# Patient Record
Sex: Female | Born: 1990 | Race: White | Hispanic: No | Marital: Married | Smoking: Never smoker
Health system: Southern US, Community
[De-identification: ages and names within clinical notes are randomized; demographics above are authoritative.]

## PROBLEM LIST (undated history)

## (undated) ENCOUNTER — Inpatient Hospital Stay (HOSPITAL_COMMUNITY): Payer: Self-pay

## (undated) DIAGNOSIS — J45909 Unspecified asthma, uncomplicated: Secondary | ICD-10-CM

## (undated) DIAGNOSIS — F419 Anxiety disorder, unspecified: Secondary | ICD-10-CM

## (undated) DIAGNOSIS — F329 Major depressive disorder, single episode, unspecified: Secondary | ICD-10-CM

## (undated) DIAGNOSIS — R102 Pelvic and perineal pain: Secondary | ICD-10-CM

## (undated) DIAGNOSIS — N926 Irregular menstruation, unspecified: Secondary | ICD-10-CM

## (undated) DIAGNOSIS — G8929 Other chronic pain: Secondary | ICD-10-CM

## (undated) DIAGNOSIS — R5383 Other fatigue: Secondary | ICD-10-CM

## (undated) DIAGNOSIS — F32A Depression, unspecified: Secondary | ICD-10-CM

## (undated) DIAGNOSIS — F909 Attention-deficit hyperactivity disorder, unspecified type: Secondary | ICD-10-CM

## (undated) DIAGNOSIS — G43909 Migraine, unspecified, not intractable, without status migrainosus: Secondary | ICD-10-CM

## (undated) HISTORY — PX: WISDOM TOOTH EXTRACTION: SHX21

---
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
## 2009-04-13 ENCOUNTER — Emergency Department (HOSPITAL_COMMUNITY): Admission: EM | Admit: 2009-04-13 | Discharge: 2009-04-13 | Payer: Self-pay | Admitting: Emergency Medicine
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
## 2011-09-19 ENCOUNTER — Encounter (HOSPITAL_COMMUNITY): Payer: Self-pay | Admitting: *Deleted

## 2011-09-19 ENCOUNTER — Emergency Department (HOSPITAL_COMMUNITY)
Admission: EM | Admit: 2011-09-19 | Discharge: 2011-09-19 | Disposition: A | Discharge status: Home / Self Care | Payer: Self-pay | Attending: Emergency Medicine | Admitting: Emergency Medicine

## 2011-09-19 DIAGNOSIS — R112 Nausea with vomiting, unspecified: Secondary | ICD-10-CM | POA: Insufficient documentation

## 2011-09-19 DIAGNOSIS — R109 Unspecified abdominal pain: Secondary | ICD-10-CM | POA: Insufficient documentation

## 2011-09-19 DIAGNOSIS — G43909 Migraine, unspecified, not intractable, without status migrainosus: Secondary | ICD-10-CM | POA: Insufficient documentation

## 2011-09-19 MED ORDER — SUMATRIPTAN SUCCINATE 100 MG PO TABS: 100.0000 mg | ORAL_TABLET | ORAL | Status: DC | PRN

## 2011-09-19 MED ORDER — METOCLOPRAMIDE HCL 5 MG/ML IJ SOLN
10.0000 mg | Freq: Once | INTRAMUSCULAR | Status: AC
  Administered 2011-09-19: 10 mg via INTRAVENOUS
  Filled 2011-09-19: qty 2

## 2011-09-19 MED ORDER — HYDROMORPHONE HCL PF 1 MG/ML IJ SOLN
1.0000 mg | Freq: Once | INTRAMUSCULAR | Status: AC
  Administered 2011-09-19: 1 mg via INTRAVENOUS
  Filled 2011-09-19: qty 1

## 2011-09-19 MED ORDER — DIPHENHYDRAMINE HCL 50 MG/ML IJ SOLN
25.0000 mg | Freq: Once | INTRAMUSCULAR | Status: AC
  Administered 2011-09-19: 25 mg via INTRAVENOUS
  Filled 2011-09-19: qty 1

## 2011-09-19 MED ORDER — SODIUM CHLORIDE 0.9 % IV BOLUS (SEPSIS)
1000.0000 mL | Freq: Once | INTRAVENOUS | Status: AC
  Administered 2011-09-19: 1000 mL via INTRAVENOUS

## 2011-09-19 NOTE — ED Notes (Addendum)
Pt stated that she came to the ED because she has been having a migraine since this AM. Pt has a hx of migraine. Pain is 10 out 10. Pt has also been nauseated but no vomiting. Pt is complaining of sensitivity to light and sound. No chest pain or respiratory distress noted. Will continue to monitor.

## 2011-09-19 NOTE — ED Notes (Signed)
Pt reports migraine headache with nausea and abdominal pain. Reports blurred vision and photophobia.

## 2011-09-19 NOTE — ED Provider Notes (Signed)
History     CSN: [id number]  Arrival date & time 09/19/11  1643   First MD Initiated Contact with Patient 09/19/11 1841      Chief Complaint  Patient presents with  . Migraine  . Nausea    (Consider location/radiation/quality/duration/timing/severity/associated sxs/prior treatment) Patient is a 21 y.o. female presenting with migraine. The history is provided by the patient.  Migraine This is a new problem. The current episode started 6 to 12 hours ago. The problem occurs constantly. The problem has not changed since onset.Associated symptoms include abdominal pain and headaches. Pertinent negatives include no chest pain and no shortness of breath. Associated symptoms comments: She has had these symptoms before with her migraines. Exacerbated by: light. The symptoms are relieved by nothing. Treatments tried: pt usually takes immitrex but she ran out of it.    History reviewed. No pertinent past medical history.  History reviewed. No pertinent past surgical history.  History reviewed. No pertinent family history.  History  Substance Use Topics  . Smoking status: Current Everyday Smoker  . Smokeless tobacco: Not on file  . Alcohol Use: No    OB History    Grav Para Term Preterm Abortions TAB SAB Ect Mult Living                  Review of Systems  Respiratory: Negative for cough and shortness of breath.   Cardiovascular: Negative for chest pain.  Gastrointestinal: Positive for nausea, vomiting and abdominal pain.  Genitourinary: Negative for dysuria.  Skin: Negative for rash.  Neurological: Positive for headaches.  All other systems reviewed and are negative.    Allergies  Codeine; Penicillins; and Sulfa antibiotics  Home Medications  No current outpatient prescriptions on file.  BP 121/82  Pulse 95  Temp(Src) 98.1 F (36.7 C) (Oral)  Resp 16  SpO2 100%  Physical Exam  Nursing note and vitals reviewed. Constitutional: She appears well-developed and  well-nourished. No distress.  HENT:  Head: Normocephalic and atraumatic.  Right Ear: External ear normal.  Left Ear: External ear normal.  Eyes: Conjunctivae are normal. Right eye exhibits no discharge. Left eye exhibits no discharge. No scleral icterus.  Neck: Neck supple. No tracheal deviation present.  Cardiovascular: Normal rate, regular rhythm and intact distal pulses.   Pulmonary/Chest: Effort normal and breath sounds normal. No stridor. No respiratory distress. She has no wheezes. She has no rales.  Abdominal: Soft. Bowel sounds are normal. She exhibits no distension. There is no tenderness. There is no rebound and no guarding.  Musculoskeletal: She exhibits no edema and no tenderness.  Neurological: She is alert. She has normal strength. No sensory deficit. Cranial nerve deficit:  no gross defecits noted. She exhibits normal muscle tone. She displays no seizure activity. Coordination normal.  Skin: Skin is warm and dry. No rash noted.  Psychiatric: She has a normal mood and affect.    ED Course  Procedures (including critical care time)  Medications  SUMAtriptan (IMITREX) 100 MG tablet (not administered)  HYDROmorphone (DILAUDID) injection 1 mg (1 mg Intravenous Given 09/19/11 1922)  metoCLOPramide (REGLAN) injection 10 mg (10 mg Intravenous Given 09/19/11 1922)  diphenhydrAMINE (BENADRYL) injection 25 mg (25 mg Intravenous Given 09/19/11 1922)  sodium chloride 0.9 % bolus 1,000 mL (1000 mL Intravenous Given 09/19/11 1922)    Labs Reviewed - No data to display No results found.   1. Migraine       MDM  Pt is feeling much better now.  Her migraine is  relieved. Patient states she no longer had her Imitrex and was taking 100 mg tablets. I will discharge her home with a prescription for       Celene Kras, MD 09/19/11 2142

## 2011-09-19 NOTE — Discharge Instructions (Signed)
Migraine Headache  A migraine is very bad pain on one or both sides of your head. The cause of a migraine is not always known. A migraine can be triggered or caused by different things, such as:   Alcohol.   Smoking.   Stress.   Periods (menstruation) in women.   Aged cheeses.   Foods or drinks that contain nitrates, glutamate, aspartame, or tyramine.   Lack of sleep.   Chocolate.   Caffeine.   Hunger.   Medicines, such as nitroglycerine (used to treat chest pain), birth control pills, estrogen, and some blood pressure medicines.  HOME CARE   Many medicines can help migraine pain or keep migraines from coming back. Your doctor can help you decide on a medicine or treatment program.   If you or your child gets a migraine, it may help to lie down in a dark, quiet room.   Keep a headache journal. This may help find out what is causing the headaches. For example, write down:   What you eat and drink.   How much sleep you get.   Any change to your diet or medicines.  GET HELP RIGHT AWAY IF:    The medicine does not work.   The pain begins again.   The neck is stiff.   You have trouble seeing.   The muscles are weak or you lose muscle control.   You have new symptoms.   You lose your balance.   You have trouble walking.   You feel faint or pass out.  MAKE SURE YOU:    Understand these instructions.   Will watch this condition.   Will get help right away if you are not doing well or get worse.  Document Released: 02/07/2008 Document Revised: 04/19/2011 Document Reviewed: 01/03/2009  [employer] Patient Information 2012 [employer].

## 2012-04-15 ENCOUNTER — Emergency Department: Payer: Self-pay | Admitting: Emergency Medicine

## 2013-07-31 ENCOUNTER — Telehealth: Payer: Self-pay | Admitting: Nurse Practitioner

## 2013-07-31 NOTE — Telephone Encounter (Signed)
appts have been fixed

## 2013-08-19 ENCOUNTER — Ambulatory Visit: Payer: Self-pay | Admitting: Nurse Practitioner

## 2013-08-24 ENCOUNTER — Encounter (INDEPENDENT_AMBULATORY_CARE_PROVIDER_SITE_OTHER): Payer: Self-pay

## 2013-08-24 ENCOUNTER — Encounter: Payer: Self-pay | Admitting: Nurse Practitioner

## 2013-08-24 ENCOUNTER — Ambulatory Visit (INDEPENDENT_AMBULATORY_CARE_PROVIDER_SITE_OTHER): Payer: Medicaid Other | Admitting: Nurse Practitioner

## 2013-08-24 VITALS — BP 116/72 | HR 67 | Temp 98.2°F | Ht 67.0 in | Wt 200.3 lb

## 2013-08-24 DIAGNOSIS — N946 Dysmenorrhea, unspecified: Secondary | ICD-10-CM

## 2013-08-24 DIAGNOSIS — F3289 Other specified depressive episodes: Secondary | ICD-10-CM

## 2013-08-24 DIAGNOSIS — R5383 Other fatigue: Secondary | ICD-10-CM

## 2013-08-24 DIAGNOSIS — F909 Attention-deficit hyperactivity disorder, unspecified type: Secondary | ICD-10-CM

## 2013-08-24 DIAGNOSIS — G43909 Migraine, unspecified, not intractable, without status migrainosus: Secondary | ICD-10-CM

## 2013-08-24 DIAGNOSIS — F329 Major depressive disorder, single episode, unspecified: Secondary | ICD-10-CM

## 2013-08-24 DIAGNOSIS — F902 Attention-deficit hyperactivity disorder, combined type: Secondary | ICD-10-CM

## 2013-08-24 DIAGNOSIS — R5381 Other malaise: Secondary | ICD-10-CM

## 2013-08-24 DIAGNOSIS — F32A Depression, unspecified: Secondary | ICD-10-CM | POA: Insufficient documentation

## 2013-08-24 MED ORDER — SUMATRIPTAN SUCCINATE 100 MG PO TABS: 100.0000 mg | ORAL_TABLET | ORAL | Status: DC | PRN

## 2013-08-24 MED ORDER — TOPIRAMATE 200 MG PO TABS: 200.0000 mg | ORAL_TABLET | Freq: Every day | ORAL | Status: DC

## 2013-08-24 MED ORDER — BUPROPION HCL ER (XL) 300 MG PO TB24: 300.0000 mg | ORAL_TABLET | Freq: Every day | ORAL | Status: DC

## 2013-08-24 NOTE — Patient Instructions (Signed)
Stress Management Stress is a state of physical or mental tension that often results from changes in your life or normal routine. Some common causes of stress are:  Death of a loved one.  Injuries or severe illnesses.  Getting fired or changing jobs.  Moving into a new home. Other causes may be:  Sexual problems.  Business or financial losses.  Taking on a large debt.  Regular conflict with someone at home or at work.  Constant tiredness from lack of sleep. It is not just bad things that are stressful. It may be stressful to:  Win the lottery.  Get married.  Buy a new car. The amount of stress that can be easily tolerated varies from person to person. Changes generally cause stress, regardless of the types of change. Too much stress can affect your health. It may lead to physical or emotional problems. Too little stress (boredom) may also become stressful. SUGGESTIONS TO REDUCE STRESS:  Talk things over with your family and friends. It often is helpful to share your concerns and worries. If you feel your problem is serious, you may want to get help from a professional counselor.  Consider your problems one at a time instead of lumping them all together. Trying to take care of everything at once may seem impossible. List all the things you need to do and then start with the most important one. Set a goal to accomplish 2 or 3 things each day. If you expect to do too many in a single day you will naturally fail, causing you to feel even more stressed.  Do not use alcohol or drugs to relieve stress. Although you may feel better for a short time, they do not remove the problems that caused the stress. They can also be habit forming.  Exercise regularly - at least 3 times per week. Physical exercise can help to relieve that "uptight" feeling and will relax you.  The shortest distance between despair and hope is often a good night's sleep.  Go to bed and get up on time allowing  yourself time for appointments without being rushed.  Take a short "time-out" period from any stressful situation that occurs during the day. Close your eyes and take some deep breaths. Starting with the muscles in your face, tense them, hold it for a few seconds, then relax. Repeat this with the muscles in your neck, shoulders, hand, stomach, back and legs.  Take good care of yourself. Eat a balanced diet and get plenty of rest.  Schedule time for having fun. Take a break from your daily routine to relax. HOME CARE INSTRUCTIONS   Call if you feel overwhelmed by your problems and feel you can no longer manage them on your own.  Return immediately if you feel like hurting yourself or someone else. Document Released: 10/24/2000 Document Revised: 07/23/2011 Document Reviewed: 12/23/2012 [employer] Patient Information 2014 [employer].  

## 2013-08-24 NOTE — Progress Notes (Signed)
   Subjective:    Patient ID: [name] Steele, female    DOB: [birth year], 23 y.o.   MRN: [id number]  HPI  Patient here today for follow up of chronic medical problems - ADHD she has been on vyvanse in the past. Has been out for over a month and is unable to concentrate at work.- was given this by another physician. -Depression- currently on wellbutrin keeps her from beoing upset and down all the time- no side Effects. -Migraines- On topamax and imitrex- has about 2x a week - avoids caffeine - irregular menses with pelvic pain- would like referral to her OB/Gyn for this. - Fatigue- says she stays tired all the time- just started several months ago  Review of Systems  Constitutional: Positive for fatigue. Negative for appetite change.  Eyes: Negative.   Respiratory: Negative.   Cardiovascular: Negative.   Gastrointestinal: Negative.   Genitourinary: Negative.   Neurological: Positive for headaches.  Psychiatric/Behavioral: Negative.        Objective:   Physical Exam  Constitutional: She is oriented to person, place, and time. She appears well-developed and well-nourished.  HENT:  Nose: Nose normal.  Mouth/Throat: Oropharynx is clear and moist.  Eyes: EOM are normal.  Neck: Trachea normal, normal range of motion and full passive range of motion without pain. Neck supple. No JVD present. Carotid bruit is not present. No thyromegaly present.  Cardiovascular: Normal rate, regular rhythm, normal heart sounds and intact distal pulses.  Exam reveals no gallop and no friction rub.   No murmur heard. Pulmonary/Chest: Effort normal and breath sounds normal.  Abdominal: Soft. Bowel sounds are normal. She exhibits no distension and no mass. There is no tenderness.  Musculoskeletal: Normal range of motion.  Lymphadenopathy:    She has no cervical adenopathy.  Neurological: She is alert and oriented to person, place, and time. She has normal reflexes.  Skin: Skin is warm and dry.  Psychiatric:  She has a normal mood and affect. Her behavior is normal. Judgment and thought content normal.     BP 116/72  Pulse 67  Temp(Src) 98.2 F (36.8 C) (Oral)  Ht $R'5\' 7"'Ag$  (1.702 m)  Wt 200 lb 4.8 oz (90.855 kg)  BMI 31.36 kg/m2      Assessment & Plan:  1. Migraines Avoid caffeine - topiramate (TOPAMAX) 200 MG tablet; Take 1 tablet (200 mg total) by mouth daily.  Dispense: 30 tablet; Refill: 5 - SUMAtriptan (IMITREX) 100 MG tablet; Take 1 tablet (100 mg total) by mouth every 2 (two) hours as needed for migraine.  Dispense: 10 tablet; Refill: 0  2. ADHD (attention deficit hyperactivity disorder), combined type Need records for previous physician before filling vyvanse  3. Depression Stress management'diet and exercise - buPROPion (WELLBUTRIN XL) 300 MG 24 hr tablet; Take 1 tablet (300 mg total) by mouth daily.  Dispense: 30 tablet; Refill: 5  4. Other malaise and fatigue Exercise Labs pending - Anemia Profile B - Thyroid Panel With TSH - CMP14+EGFR  5. Dysmenorrhea  - Ambulatory referral to Gynecology  [place], FNP

## 2013-08-25 LAB — ANEMIA PROFILE B
BASOS: 0 %
Basophils Absolute: 0 10*3/uL (ref 0.0–0.2)
EOS ABS: 0.2 10*3/uL (ref 0.0–0.4)
Eos: 2 %
FOLATE: 15.3 ng/mL (ref >3.0)
Ferritin: 22 ng/mL (ref 15–150)
HCT: 37.7 % (ref 34.0–46.6)
Hemoglobin: 12.8 g/dL (ref 11.1–15.9)
IMMATURE GRANS (ABS): 0 10*3/uL (ref 0.0–0.1)
Immature Granulocytes: 0 %
Iron Saturation: 8 % — CL (ref 15–55)
Iron: 31 ug/dL — ABNORMAL LOW (ref 35–155)
LYMPHS: 30 %
Lymphocytes Absolute: 2.3 10*3/uL (ref 0.7–3.1)
MCH: 29.3 pg (ref 26.6–33.0)
MCHC: 34 g/dL (ref 31.5–35.7)
MCV: 86 fL (ref 79–97)
MONOCYTES: 5 %
Monocytes Absolute: 0.4 10*3/uL (ref 0.1–0.9)
Neutrophils Absolute: 4.9 10*3/uL (ref 1.4–7.0)
Neutrophils Relative %: 63 %
Platelets: 233 10*3/uL (ref 150–379)
RBC: 4.37 x10E6/uL (ref 3.77–5.28)
RDW: 14 % (ref 12.3–15.4)
RETIC CT PCT: 0.9 % (ref 0.6–2.6)
TIBC: 375 ug/dL (ref 250–450)
UIBC: 344 ug/dL (ref 150–375)
VITAMIN B 12: 547 pg/mL (ref 211–946)
WBC: 7.8 10*3/uL (ref 3.4–10.8)

## 2013-08-25 LAB — CMP14+EGFR
A/G RATIO: 2.1 (ref 1.1–2.5)
ALK PHOS: 79 IU/L (ref 39–117)
ALT: 14 IU/L (ref 0–32)
AST: 9 IU/L (ref 0–40)
Albumin: 4.8 g/dL (ref 3.5–5.5)
BUN / CREAT RATIO: 10 (ref 8–20)
BUN: 9 mg/dL (ref 6–20)
CO2: 21 mmol/L (ref 18–29)
Calcium: 9.9 mg/dL (ref 8.7–10.2)
Chloride: 104 mmol/L (ref 97–108)
Creatinine, Ser: 0.88 mg/dL (ref 0.57–1.00)
GFR calc Af Amer: 106 mL/min/{1.73_m2} (ref >59)
GFR calc non Af Amer: 92 mL/min/{1.73_m2} (ref >59)
Globulin, Total: 2.3 g/dL (ref 1.5–4.5)
Glucose: 91 mg/dL (ref 65–99)
Potassium: 4.3 mmol/L (ref 3.5–5.2)
SODIUM: 144 mmol/L (ref 134–144)
Total Bilirubin: 0.3 mg/dL (ref 0.0–1.2)
Total Protein: 7.1 g/dL (ref 6.0–8.5)

## 2013-08-25 LAB — THYROID PANEL WITH TSH
Free Thyroxine Index: 1.1 — ABNORMAL LOW (ref 1.2–4.9)
T3 UPTAKE RATIO: 21 % — AB (ref 24–39)
T4, Total: 5.4 ug/dL (ref 4.5–12.0)
TSH: 0.634 u[IU]/mL (ref 0.450–4.500)

## 2013-09-23 ENCOUNTER — Ambulatory Visit: Payer: Medicaid Other | Admitting: Family Medicine

## 2013-09-23 ENCOUNTER — Telehealth: Payer: Self-pay | Admitting: Family Medicine

## 2013-09-23 ENCOUNTER — Emergency Department (HOSPITAL_COMMUNITY): Payer: Medicaid Other

## 2013-09-23 ENCOUNTER — Encounter (HOSPITAL_COMMUNITY): Payer: Self-pay | Admitting: Emergency Medicine

## 2013-09-23 ENCOUNTER — Emergency Department (HOSPITAL_COMMUNITY)
Admission: EM | Admit: 2013-09-23 | Discharge: 2013-09-23 | Disposition: A | Discharge status: Home / Self Care | Payer: Medicaid Other | Attending: Emergency Medicine | Admitting: Emergency Medicine

## 2013-09-23 DIAGNOSIS — J45901 Unspecified asthma with (acute) exacerbation: Secondary | ICD-10-CM | POA: Insufficient documentation

## 2013-09-23 DIAGNOSIS — F3289 Other specified depressive episodes: Secondary | ICD-10-CM | POA: Insufficient documentation

## 2013-09-23 DIAGNOSIS — G8929 Other chronic pain: Secondary | ICD-10-CM | POA: Insufficient documentation

## 2013-09-23 DIAGNOSIS — Z3202 Encounter for pregnancy test, result negative: Secondary | ICD-10-CM | POA: Insufficient documentation

## 2013-09-23 DIAGNOSIS — J069 Acute upper respiratory infection, unspecified: Secondary | ICD-10-CM | POA: Insufficient documentation

## 2013-09-23 DIAGNOSIS — Z8742 Personal history of other diseases of the female genital tract: Secondary | ICD-10-CM | POA: Insufficient documentation

## 2013-09-23 DIAGNOSIS — Z880 Allergy status to penicillin: Secondary | ICD-10-CM | POA: Insufficient documentation

## 2013-09-23 DIAGNOSIS — F411 Generalized anxiety disorder: Secondary | ICD-10-CM | POA: Insufficient documentation

## 2013-09-23 DIAGNOSIS — F17200 Nicotine dependence, unspecified, uncomplicated: Secondary | ICD-10-CM | POA: Insufficient documentation

## 2013-09-23 DIAGNOSIS — F909 Attention-deficit hyperactivity disorder, unspecified type: Secondary | ICD-10-CM | POA: Insufficient documentation

## 2013-09-23 DIAGNOSIS — G43909 Migraine, unspecified, not intractable, without status migrainosus: Secondary | ICD-10-CM | POA: Insufficient documentation

## 2013-09-23 DIAGNOSIS — Z79899 Other long term (current) drug therapy: Secondary | ICD-10-CM | POA: Insufficient documentation

## 2013-09-23 DIAGNOSIS — F329 Major depressive disorder, single episode, unspecified: Secondary | ICD-10-CM | POA: Insufficient documentation

## 2013-09-23 HISTORY — DX: Attention-deficit hyperactivity disorder, unspecified type: F90.9

## 2013-09-23 HISTORY — DX: Other fatigue: R53.83

## 2013-09-23 HISTORY — DX: Migraine, unspecified, not intractable, without status migrainosus: G43.909

## 2013-09-23 HISTORY — DX: Major depressive disorder, single episode, unspecified: F32.9

## 2013-09-23 HISTORY — DX: Other chronic pain: G89.29

## 2013-09-23 HISTORY — DX: Pelvic and perineal pain: R10.2

## 2013-09-23 HISTORY — DX: Depression, unspecified: F32.A

## 2013-09-23 HISTORY — DX: Anxiety disorder, unspecified: F41.9

## 2013-09-23 HISTORY — DX: Irregular menstruation, unspecified: N92.6

## 2013-09-23 LAB — URINALYSIS, ROUTINE W REFLEX MICROSCOPIC
Bilirubin Urine: NEGATIVE
GLUCOSE, UA: NEGATIVE mg/dL
HGB URINE DIPSTICK: NEGATIVE
Ketones, ur: NEGATIVE mg/dL
LEUKOCYTES UA: NEGATIVE
Nitrite: NEGATIVE
PROTEIN: NEGATIVE mg/dL
Specific Gravity, Urine: 1.015 (ref 1.005–1.030)
Urobilinogen, UA: 0.2 mg/dL (ref 0.0–1.0)
pH: 8.5 — ABNORMAL HIGH (ref 5.0–8.0)

## 2013-09-23 LAB — PREGNANCY, URINE: Preg Test, Ur: NEGATIVE

## 2013-09-23 LAB — RAPID STREP SCREEN (MED CTR MEBANE ONLY): Streptococcus, Group A Screen (Direct): NEGATIVE

## 2013-09-23 MED ORDER — ALBUTEROL SULFATE HFA 108 (90 BASE) MCG/ACT IN AERS
2.0000 | INHALATION_SPRAY | RESPIRATORY_TRACT | Status: AC
  Administered 2013-09-23: 2 via RESPIRATORY_TRACT
  Filled 2013-09-23: qty 6.7

## 2013-09-23 MED ORDER — PREDNISONE 20 MG PO TABS: 40.0000 mg | ORAL_TABLET | Freq: Every day | ORAL | Status: DC

## 2013-09-23 MED ORDER — BENZONATATE 100 MG PO CAPS: 100.0000 mg | ORAL_CAPSULE | Freq: Three times a day (TID) | ORAL | Status: DC | PRN

## 2013-09-23 MED ORDER — IPRATROPIUM-ALBUTEROL 0.5-2.5 (3) MG/3ML IN SOLN
3.0000 mL | Freq: Once | RESPIRATORY_TRACT | Status: AC
  Administered 2013-09-23: 3 mL via RESPIRATORY_TRACT
  Filled 2013-09-23: qty 3

## 2013-09-23 MED ORDER — ALBUTEROL SULFATE (2.5 MG/3ML) 0.083% IN NEBU
2.5000 mg | INHALATION_SOLUTION | Freq: Once | RESPIRATORY_TRACT | Status: AC
  Administered 2013-09-23: 2.5 mg via RESPIRATORY_TRACT
  Filled 2013-09-23: qty 3

## 2013-09-23 NOTE — ED Notes (Signed)
Pt tolerated 240 cc of PO intake.

## 2013-09-23 NOTE — ED Notes (Signed)
Pt reports chest tightness. Clarene DukeMcManus, DO notified.

## 2013-09-23 NOTE — Discharge Instructions (Signed)
°Emergency Department Resource Guide °1) Find a Doctor and Pay Out of Pocket °Although you won't have to find out who is covered by your insurance plan, it is a good idea to ask around and get recommendations. You will then need to call the office and see if the doctor you have chosen will accept you as a new patient and what types of options they offer for patients who are self-pay. Some doctors offer discounts or will set up payment plans for their patients who do not have insurance, but you will need to ask so you aren't surprised when you get to your appointment. ° °2) Contact Your Local Health Department °Not all health departments have doctors that can see patients for sick visits, but many do, so it is worth a call to see if yours does. If you don't know where your local health department is, you can check in your phone book. The CDC also has a tool to help you locate your state's health department, and many state websites also have listings of all of their local health departments. ° °3) Find a Walk-in Clinic °If your illness is not likely to be very severe or complicated, you may want to try a walk in clinic. These are popping up all over the country in pharmacies, drugstores, and shopping centers. They're usually staffed by nurse practitioners or physician assistants that have been trained to treat common illnesses and complaints. They're usually fairly quick and inexpensive. However, if you have serious medical issues or chronic medical problems, these are probably not your best option. ° °No Primary Care Doctor: °- Call [facility] Connect at  832-8000 - they can help you locate a primary care doctor that  accepts your insurance, provides certain services, etc. °- Physician Referral Service- 1-800-533-3463 ° °Chronic Pain Problems: °Organization         Address  Phone   Notes  °[facility]  (336) 297-2271 Patients need to be referred by their primary care doctor.  ° °Medication  Assistance: °Organization         Address  Phone   Notes  °[place] [facility] [address] °[place], [place] [address] (336) 641-8030 --Must be a resident of [place] °-- Must have NO insurance coverage whatsoever (no Medicaid/ Medicare, etc.) °-- The pt. MUST have a primary care doctor that directs their care regularly and follows them in the community °  °[facility]  (866) 331-1348   °[employer]  (888) 892-1162   ° °Agencies that provide inexpensive medical care: °Organization         Address  Phone   Notes  °[facility]  (336) 832-8035   °[facility]    (336) 832-7272   °[facility] [address] °[place], [place] [address] (336) 832-4777   °[facility] [address], °[place] (336) 271-4999   °[facility]    (336) 373-0678   °[facility]    (336) 272-1050   °[facility] ° [address], [place] Phone:  (336) 832-4444, Fax:  (336) 832-4440 Hours of Operation:  9 am - 6 pm, M-F.  Also accepts Medicaid/Medicare and self-pay.  °[facility] ° [address], [place] Phone: (336) [phone], Fax: (336) [phone]. Hours of Operation:  8:30 am - 5:30 pm, M-F.  Also accepts Medicaid and self-pay.  °[facility] [place] [address], [place] Phone: (336) 878-6027   °[facility] [address], [place], [place] (336)723-1848, Ext. 123 Mondays & Thursdays: 7-9 AM.  First 15 patients are seen on a first come, first serve basis. °  ° °Medicaid-accepting [place] Providers: ° °Organization         Address  Phone   Notes  °[facility] [address], [place] (336) 641-2100 Also accepts self-pay patients.  °[facility] [address], [place] ° (336) 856-9996   °[facility] [address], [place]  (336) 288-8857   °[facility] [address] [place] [address], [place] (336) 299-7000   °Veita Bland [address], [place]  ° (336) 373-1557 Only accepts [place] Access Medicaid patients after they have their name applied to their card.  ° °Self-Pay (no insurance) in [place]: ° °Organization         Address  Phone   Notes  °Sickle Cell Patients, [facility] [address], [place] (336) 832-1970   °[facility] [address], [place] (336) 832-4400   °[facility] ° [address], [place] (336) 992-4800   °[facility]/Dr. Osei-Bonsu ° [address] [place] [address], [place] or [address], [place] (336) [phone] Phone number for both [place] and [place] locations is the same.  °[facility] [address], [place] (336) 299-0000   °[facility] [address] [place] [address], [place] or [address] (336) 852-7530 °(336) 878-2260   °[facility] [address], [place] (336) 350-1642, phone; (336) 294-5005, fax Sees patients 1st and 3rd Saturday of every month.  Must not qualify for public or private insurance (i.e. Medicaid, Medicare, [place] Health Choice, Veterans' Benefits) • Household income should be no more than 200% of the poverty level •The clinic cannot treat you if you are pregnant or think you are pregnant • Sexually transmitted diseases are not treated at the clinic.  ° ° °Dental Care: °Organization         Address  Phone  Notes  °[place] [facility] [address], [place] (336) 641-6152 Accepts children up to age 21 who are enrolled in Medicaid or [place] Health Choice; pregnant women with a Medicaid card; and children who have applied for Medicaid or [place] Health Choice, but were declined, whose parents can pay a reduced fee at time of service.  °[place]  [facility] [place]  [address], [place] (336) 641-7733 Accepts children up to age 21 who are enrolled in Medicaid or [place] Health Choice; pregnant women with a Medicaid card; and children who have applied for Medicaid or [place] Health Choice, but were declined, whose parents can pay a reduced fee at time of service.  °[facility] ° [address], [place] (336) 641-4533 Patients are seen by appointment only. Walk-ins are not accepted. [facility] will see patients 18 years of age and older. °Monday - Tuesday (8am-5pm) °Most Wednesdays (8:30-5pm) °$30 per visit, cash only  °[facility] ° [address], [place] (336) 641-4533 Patients are seen by appointment only. Walk-ins are not accepted. [facility] will see patients 18 years of age and older. °One   Wednesday Evening (Monthly: Volunteer Based).  $30 per visit, cash only  °[facility]  (919) 537-3737 for adults; Children under age 4, call Graduate Pediatric Dentistry at ([phone]) [phone]. Children aged 4-14, please call (919) [phone] to request a pediatric application. ° Dental services are provided in all areas of dental care including fillings, crowns and bridges, complete and partial dentures, implants, gum treatment, root canals, and extractions. Preventive care is also provided. Treatment is provided to both adults and children. °Patients are selected via a lottery and there is often a waiting list. °  °[facility] [address], °[place] ° (336) 763-8833 [email or web address] °  °[facility] [address], [place], [place] (336)723-1848, Ext. 123 Second and Fourth Thursday of each month, opens at 6:30 AM; Clinic ends at 9 AM.  Patients are seen on a first-come first-served basis, and a limited number are seen during each clinic.  ° °[facility] ° [address], [place], [place] (336) 723-7904    Eligibility Requirements °You must have lived in [place], [place], or [place] counties for at least the last three months. °  You cannot be eligible for state or federal sponsored healthcare insurance, including Veterans Administration, Medicaid, or Medicare. °  You generally cannot be eligible for healthcare insurance through your employer.  °  How to apply: °Eligibility screenings are held every Tuesday and Wednesday afternoon from 1:00 pm until 4:00 pm. You do not need an appointment for the interview!  °[facility] [address], [place], [place] 336-631-2330   °[facility]  336-342-8273   °[facility]  336-703-3100   °[facility]  336-570-6415   ° °Behavioral Health Resources in the Community: °Intensive Outpatient Programs °Organization         Address  Phone  Notes  °[facility] [address], [place], [place] 336-878-6098   °[facility] [address], [place], [place] 336-832-9800   °[facility] [address], [place], [place] ° 336-882-2125   °[facility] [address],  °[place], [place] 1-800-853-5163 or 336-641-4981   °Substance Abuse Resources °Organization         Address  Phone  Notes  °[facility]  336-882-2125   °[facility]  336-784-9470   °[facility]  336-285-9073   °[facility]  336-845-3988   °Residential & Outpatient Substance Abuse Program  1-800-659-3381   °Psychological Services °Organization         Address  Phone  Notes  °[facility]  336- 832-9600   °[facility]  336- 378-7881   °[facility] [address], [place] 1-800-853-5163 or 336-641-4981   ° °Mobile Crisis Teams °Organization         Address  Phone  Notes  °[facility], Mobile Crisis Care Unit  1-877-626-1772   °Assertive °[facility] ° [address]  [place], [place] 336-834-9664   °Sharon DeEsch [address] °[place] 336-554-5454   ° °Self-Help/Support Groups °Organization         Address  Phone             Notes  °[facility] of  - variety of support groups  336- 373-1402 Call for more information  °Narcotics Anonymous (NA), [facility] [address], °[place]  2 meetings at this location  ° °  Residential Treatment Programs Organization         Address  Phone  Notes  [facility] [address],    [place]  [phone])772-2763   [facility]  [address], [place] [phone], [place], [place] [phone]   [facility] [address], [place] [phone] Admissions: 8am-3pm M-F  [facility] [address]    [place], [place] [phone]   [facility] [address], [place], [place] [phone]   [facility] [address]  [place], [place] [phone]   [facility] Intensive Outpatient [address], [place], [place] [phone]   [facility] ([facility] [address]  [place], [place] [phone])[phone] or ([phone]   [facility] [address] [place], [place] [phone] Accepts Medicaid  [facility] [address]  [place] [phone] Substance Abuse/Addiction Treatment   [facility] Organization         Address  Phone  Notes  [facility]  [phone](888) [phone]   Angie FavaJulie Brannon, PhD [address] [place], [place]   ([phone](336) [phone] or [phone](336) [phone]   [facility]   [address] [place], [place] [phone](336) [phone]   [facility] [address], [place], [place] [phone](336) [phone] Insurance/Medicaid/sponsorship through [facility] [address]                                    [place], [place] [phone](336) [phone] Therapy/tele-psych/case    [facility] [address]   [place], [place] [phone](336) [phone]    Dr. Lolly MustacheArfeen  [phone](336) [phone]   [facility]  [facility] 1) [address], [place] 2) [address], [place] 3)  [address], [place] [phone](336) [phone](336) [phone]  [phone](336) [phone]   [facility] Child Abuse Hotline 7180049720(336) 8060803961 or [phone](336) [phone] (After Hours)       Take the prescriptions as directed.  Use your albuterol inhaler (2 to 4 puffs) every 4 hours for the next 7 days, then as needed for cough, wheezing, or shortness of breath. Take over the counter decongestant (such as sudafed), as well as an antihistamine (such as claritin or zyrtec) as directed on packaging, for the next 1 to 2 weeks.  Use over the counter normal saline nasal spray, as instructed in the Emergency Department, several times per day for the next 2 weeks.  Call your regular medical doctor today to schedule a follow up appointment within the next 2 days.  Return to the Emergency Department immediately if worsening.

## 2013-09-23 NOTE — Telephone Encounter (Signed)
appt given for Friday with mmm 

## 2013-09-23 NOTE — ED Notes (Signed)
Pt states she has had a fever, sore throat and sinus infection. Per EMS, pt was given two albuterol breathing treatments, one Atrovent, solumedrol 125 mg prior to arrival. Also, EMS states pt became unresponsive on them and they had to use an ammonia inhalent. Pt alert and orient. No respiratory distress on arrival to the ED

## 2013-09-23 NOTE — ED Notes (Signed)
Pt received discharge instructions and prescriptions, verbalized understanding and has no further questions. Pt ambulated to exit in stable condition accompanied by mother.  Pt refuses discharge vitals.

## 2013-09-23 NOTE — ED Provider Notes (Signed)
CSN: [id number]     Arrival date & time 09/23/13  1108 History   First MD Initiated Contact with Patient 09/23/13 1122     Chief Complaint  Patient presents with  . Fever      HPI Pt was seen at 1130.  Per pt, c/o gradual onset and persistence of constant sore throat, runny/stuffy nose, sinus congestion, and cough for the past 3 days. Multiple others in household with similar symptoms. EMS gave pt neb and IV solumedrol en route for c/o cough. EMS also states pt "passed out" and awoke immediately with an ammonia inhalent. Denies objective fevers, no rash, no CP/SOB, no N/V/D, no abd pain.     Past Medical History  Diagnosis Date  . Migraine   . Fatigue   . Irregular menses   . Depression   . ADHD (attention deficit hyperactivity disorder)   . Chronic pelvic pain in female   . Anxiety    Past Surgical History  Procedure Laterality Date  . Cesarean section  2008,2010    History  Substance Use Topics  . Smoking status: Current Every Day Smoker  . Smokeless tobacco: Not on file  . Alcohol Use: No    Review of Systems ROS: Statement: All systems negative except as marked or noted in the HPI; Constitutional: Negative for objective fever and +chills, +generalized body aches/fatigue. ; ; Eyes: Negative for eye pain, redness and discharge. ; ; ENMT: Negative for ear pain, hoarseness, +nasal congestion, rhinorrhea, sinus pressure and sore throat. ; ; Cardiovascular: Negative for chest pain, palpitations, diaphoresis, dyspnea and peripheral edema. ; ; Respiratory: +cough. Negative for wheezing and stridor. ; ; Gastrointestinal: Negative for nausea, vomiting, diarrhea, abdominal pain, blood in stool, hematemesis, jaundice and rectal bleeding. . ; ; Genitourinary: Negative for dysuria, flank pain and hematuria. ; ; Musculoskeletal: Negative for back pain and neck pain. Negative for swelling and trauma.; ; Skin: Negative for pruritus, rash, abrasions, blisters, bruising and skin lesion.; ;  Neuro: Negative for headache, lightheadedness and neck stiffness. Negative for altered level of consciousness , altered mental status, extremity weakness, paresthesias, involuntary movement, seizure and syncope.      Allergies  Codeine; Penicillins; and Sulfa antibiotics  Home Medications   Prior to Admission medications   Medication Sig Start Date End Date Taking? Authorizing Provider  buPROPion (WELLBUTRIN XL) 300 MG 24 hr tablet Take 1 tablet (300 mg total) by mouth daily. 08/24/13   Mary-Margaret Daphine DeutscherMartin, FNP  Lisdexamfetamine Dimesylate (VYVANSE PO) Take by mouth.    Historical Provider, MD  SUMAtriptan (IMITREX) 100 MG tablet Take 1 tablet (100 mg total) by mouth every 2 (two) hours as needed for migraine. 08/24/13 08/24/14  Mary-Margaret Daphine DeutscherMartin, FNP  topiramate (TOPAMAX) 200 MG tablet Take 1 tablet (200 mg total) by mouth daily. 08/24/13   Mary-Margaret Daphine DeutscherMartin, FNP   BP 105/53  Pulse 93  Temp(Src) 97.9 F (36.6 C) (Oral)  Resp 21  Ht 5\' 7"  (1.702 m)  Wt 192 lb (87.091 kg)  BMI 30.06 kg/m2  SpO2 100%  LMP 09/19/2013 Physical Exam 1135: Physical examination:  Nursing notes reviewed; Vital signs and O2 SAT reviewed;  Constitutional: Well developed, Well nourished, Well hydrated, In no acute distress; Head:  Normocephalic, atraumatic; Eyes: EOMI, PERRL, No scleral icterus; ENMT: TM's clear bilat. +edemetous nasal turbinates bilat with clear rhinorrhea. Mouth and pharynx without lesions. No tonsillar exudates. No intra-oral edema. No submandibular or sublingual edema. No hoarse voice, no drooling, no stridor. No pain with manipulation of  larynx. No trismus. Mouth and pharynx normal, Mucous membranes moist; Neck: Supple, Full range of motion, No lymphadenopathy; Cardiovascular: Regular rate and rhythm, No murmur, rub, or gallop; Respiratory: Breath sounds coarse & equal bilaterally, faint exp wheezes. No audible wheezing. Speaking full sentences with ease, Normal respiratory effort/excursion;  Chest: Nontender, Movement normal; Abdomen: Soft, Nontender, Nondistended, Normal bowel sounds; Genitourinary: No CVA tenderness; Extremities: Pulses normal, No tenderness, No edema, No calf edema or asymmetry.; Neuro: AA&Ox3, Major CN grossly intact.  Speech clear. No gross focal motor or sensory deficits in extremities.; Skin: Color normal, Warm, Dry.   ED Course  Procedures     EKG Interpretation None      MDM  MDM Reviewed: previous chart, nursing note and vitals Reviewed previous: labs Interpretation: labs and x-ray    Results for orders placed during the hospital encounter of 09/23/13  RAPID STREP SCREEN      Result Value Ref Range   Streptococcus, Group A Screen (Direct) NEGATIVE  NEGATIVE  URINALYSIS, ROUTINE W REFLEX MICROSCOPIC      Result Value Ref Range   Color, Urine YELLOW  YELLOW   APPearance CLEAR  CLEAR   Specific Gravity, Urine 1.015  1.005 - 1.030   pH 8.5 (*) 5.0 - 8.0   Glucose, UA NEGATIVE  NEGATIVE mg/dL   Hgb urine dipstick NEGATIVE  NEGATIVE   Bilirubin Urine NEGATIVE  NEGATIVE   Ketones, ur NEGATIVE  NEGATIVE mg/dL   Protein, ur NEGATIVE  NEGATIVE mg/dL   Urobilinogen, UA 0.2  0.0 - 1.0 mg/dL   Nitrite NEGATIVE  NEGATIVE   Leukocytes, UA NEGATIVE  NEGATIVE  PREGNANCY, URINE      Result Value Ref Range   Preg Test, Ur NEGATIVE  NEGATIVE   Dg Chest 2 View 09/23/2013   CLINICAL DATA:  Fever and cough.  EXAM: CHEST  2 VIEW  COMPARISON:  None.  FINDINGS: The heart size and mediastinal contours are within normal limits. Both lungs are clear. The visualized skeletal structures are unremarkable.  IMPRESSION: No active cardiopulmonary disease.   Electronically Signed   By: Richarda OverlieAdam  Henn M.D.   On: 09/23/2013 12:23    1400:  Pt states she "feels better" after another neb and steroid.  NAD, lungs coarse bilat, no wheezing, resps easy, speaking full sentences, Sats 100% R/A.  Pt ambulated around the ED with resps easy, NAD. States she has to go home now. Will  continue to tx symptomatically for URI and asthma exacerbation at this time. Dx and testing d/w pt and family.  Questions answered.  Verb understanding, agreeable to d/c home with outpt f/u.     Laray AngerKathleen M Mathews Stuhr, DO 09/25/13 2151

## 2013-09-25 ENCOUNTER — Encounter: Payer: Self-pay | Admitting: Nurse Practitioner

## 2013-09-25 ENCOUNTER — Ambulatory Visit (INDEPENDENT_AMBULATORY_CARE_PROVIDER_SITE_OTHER): Payer: Medicaid Other | Admitting: Nurse Practitioner

## 2013-09-25 VITALS — BP 124/78 | HR 78 | Temp 98.3°F | Ht 67.0 in | Wt 197.8 lb

## 2013-09-25 DIAGNOSIS — Z09 Encounter for follow-up examination after completed treatment for conditions other than malignant neoplasm: Secondary | ICD-10-CM

## 2013-09-25 DIAGNOSIS — J441 Chronic obstructive pulmonary disease with (acute) exacerbation: Secondary | ICD-10-CM

## 2013-09-25 LAB — CULTURE, GROUP A STREP

## 2013-09-25 MED ORDER — AZITHROMYCIN 250 MG PO TABS: ORAL_TABLET | ORAL | Status: DC

## 2013-09-25 MED ORDER — METHYLPREDNISOLONE ACETATE 80 MG/ML IJ SUSP
80.0000 mg | Freq: Once | INTRAMUSCULAR | Status: AC
  Administered 2013-09-25: 80 mg via INTRAMUSCULAR

## 2013-09-25 MED ORDER — BUDESONIDE-FORMOTEROL FUMARATE 80-4.5 MCG/ACT IN AERO: 2.0000 | INHALATION_SPRAY | Freq: Two times a day (BID) | RESPIRATORY_TRACT | Status: DC

## 2013-09-25 NOTE — Progress Notes (Signed)
   Subjective:    Patient ID: [name] Steele, female    DOB: 02/03/1991, 23 y.o.   MRN: [id number]  HPIPatient in today for hospital follow up- Was taken by EMS with SOB- diagnosed with URI and asthma exacerbation- Was given prednisone, albuterol HFA and cough meds. She feels no better- Has been using sisters Nebulizer which is the only thing that helps.    Review of Systems  Constitutional: Positive for appetite change. Negative for fever, chills and fatigue.  HENT: Positive for congestion and sinus pressure.   Respiratory: Positive for cough (productive).   Cardiovascular: Negative.   Gastrointestinal: Negative.   Genitourinary: Negative.   Neurological: Negative.   Psychiatric/Behavioral: Negative.        Objective:   Physical Exam  Constitutional: She is oriented to person, place, and time. She appears well-developed and well-nourished.  HENT:  Right Ear: External ear normal.  Left Ear: External ear normal.  Nose: Nose normal.  Mouth/Throat: Oropharynx is clear and moist.  Eyes: Pupils are equal, round, and reactive to light.  Neck: Normal range of motion. Neck supple.  Cardiovascular: Normal rate, regular rhythm and normal heart sounds.   Pulmonary/Chest: Effort normal. She has wheezes (isp and exp wheezes throughout).  Abdominal: Soft. Bowel sounds are normal.  Lymphadenopathy:    She has no cervical adenopathy.  Neurological: She is alert and oriented to person, place, and time.  Skin: Skin is warm and dry.  Psychiatric: She has a normal mood and affect. Her behavior is normal. Judgment and thought content normal.   BP 124/78  Pulse 78  Temp(Src) 98.3 F (36.8 C) (Oral)  Ht 5\' 7"  (1.702 m)  Wt 197 lb 12.8 oz (89.721 kg)  BMI 30.97 kg/m2  LMP 09/19/2013        Assessment & Plan:   1. COPD exacerbation   2. Hospital discharge follow-up    Meds ordered this encounter  Medications  . methylPREDNISolone acetate (DEPO-MEDROL) injection 80 mg    Sig:   .  azithromycin (ZITHROMAX Z-PAK) 250 MG tablet    Sig: As directed    Dispense:  6 each    Refill:  0    Order Specific Question:  Supervising Provider    Answer:  [name] Steele [[id number]]  . budesonide-formoterol (SYMBICORT) 80-4.5 MCG/ACT inhaler    Sig: Inhale 2 puffs into the lungs 2 (two) times daily.    Dispense:  1 Inhaler    Refill:  0    Order Specific Question:  Supervising Provider    Answer:  [name] Steele [facility][[id number]]  Hospital records reviewed Stop oral prednisone Added symbicort- not rescue inhaler- 2 puffs BID STOP SMOKING RTO Monday for recheck  [name] Daphine DeutscherMartin, FNP

## 2013-09-25 NOTE — Patient Instructions (Signed)
Smoking Cessation Quitting smoking is important to your health and has many advantages. However, it is not always easy to quit since nicotine is a very addictive drug. Often times, people try 3 times or more before being able to quit. This document explains the best ways for you to prepare to quit smoking. Quitting takes hard work and a lot of effort, but you can do it. ADVANTAGES OF QUITTING SMOKING  You will live longer, feel better, and live better.  Your body will feel the impact of quitting smoking almost immediately.  Within 20 minutes, blood pressure decreases. Your pulse returns to its normal level.  After 8 hours, carbon monoxide levels in the blood return to normal. Your oxygen level increases.  After 24 hours, the chance of having a heart attack starts to decrease. Your breath, hair, and body stop smelling like smoke.  After 48 hours, damaged nerve endings begin to recover. Your sense of taste and smell improve.  After 72 hours, the body is virtually free of nicotine. Your bronchial tubes relax and breathing becomes easier.  After 2 to 12 weeks, lungs can hold more air. Exercise becomes easier and circulation improves.  The risk of having a heart attack, stroke, cancer, or lung disease is greatly reduced.  After 1 year, the risk of coronary heart disease is cut in half.  After 5 years, the risk of stroke falls to the same as a nonsmoker.  After 10 years, the risk of lung cancer is cut in half and the risk of other cancers decreases significantly.  After 15 years, the risk of coronary heart disease drops, usually to the level of a nonsmoker.  If you are pregnant, quitting smoking will improve your chances of having a healthy baby.  The people you live with, especially any children, will be healthier.  You will have extra money to spend on things other than cigarettes. QUESTIONS TO THINK ABOUT BEFORE ATTEMPTING TO QUIT You may want to talk about your answers with your  caregiver.  Why do you want to quit?  If you tried to quit in the past, what helped and what did not?  What will be the most difficult situations for you after you quit? How will you plan to handle them?  Who can help you through the tough times? Your family? Friends? A caregiver?  What pleasures do you get from smoking? What ways can you still get pleasure if you quit? Here are some questions to ask your caregiver:  How can you help me to be successful at quitting?  What medicine do you think would be best for me and how should I take it?  What should I do if I need more help?  What is smoking withdrawal like? How can I get information on withdrawal? GET READY  Set a quit date.  Change your environment by getting rid of all cigarettes, ashtrays, matches, and lighters in your home, car, or work. Do not let people smoke in your home.  Review your past attempts to quit. Think about what worked and what did not. GET SUPPORT AND ENCOURAGEMENT You have a better chance of being successful if you have help. You can get support in many ways.  Tell your family, friends, and co-workers that you are going to quit and need their support. Ask them not to smoke around you.  Get individual, group, or telephone counseling and support. Programs are available at local hospitals and health centers. Call your local health department for   information about programs in your area.  Spiritual beliefs and practices may help some smokers quit.  Download a "quit meter" on your computer to keep track of quit statistics, such as how long you have gone without smoking, cigarettes not smoked, and money saved.  Get a self-help book about quitting smoking and staying off of tobacco. LEARN NEW SKILLS AND BEHAVIORS  Distract yourself from urges to smoke. Talk to someone, go for a walk, or occupy your time with a task.  Change your normal routine. Take a different route to work. Drink tea instead of coffee.  Eat breakfast in a different place.  Reduce your stress. Take a hot bath, exercise, or read a book.  Plan something enjoyable to do every day. Reward yourself for not smoking.  Explore interactive web-based programs that specialize in helping you quit. GET MEDICINE AND USE IT CORRECTLY Medicines can help you stop smoking and decrease the urge to smoke. Combining medicine with the above behavioral methods and support can greatly increase your chances of successfully quitting smoking.  Nicotine replacement therapy helps deliver nicotine to your body without the negative effects and risks of smoking. Nicotine replacement therapy includes nicotine gum, lozenges, inhalers, nasal sprays, and skin patches. Some may be available over-the-counter and others require a prescription.  Antidepressant medicine helps people abstain from smoking, but how this works is unknown. This medicine is available by prescription.  Nicotinic receptor partial agonist medicine simulates the effect of nicotine in your brain. This medicine is available by prescription. Ask your caregiver for advice about which medicines to use and how to use them based on your health history. Your caregiver will tell you what side effects to look out for if you choose to be on a medicine or therapy. Carefully read the information on the package. Do not use any other product containing nicotine while using a nicotine replacement product.  RELAPSE OR DIFFICULT SITUATIONS Most relapses occur within the first 3 months after quitting. Do not be discouraged if you start smoking again. Remember, most people try several times before finally quitting. You may have symptoms of withdrawal because your body is used to nicotine. You may crave cigarettes, be irritable, feel very hungry, cough often, get headaches, or have difficulty concentrating. The withdrawal symptoms are only temporary. They are strongest when you first quit, but they will go away within  10 14 days. To reduce the chances of relapse, try to:  Avoid drinking alcohol. Drinking lowers your chances of successfully quitting.  Reduce the amount of caffeine you consume. Once you quit smoking, the amount of caffeine in your body increases and can give you symptoms, such as a rapid heartbeat, sweating, and anxiety.  Avoid smokers because they can make you want to smoke.  Do not let weight gain distract you. Many smokers will gain weight when they quit, usually less than 10 pounds. Eat a healthy diet and stay active. You can always lose the weight gained after you quit.  Find ways to improve your mood other than smoking. FOR MORE INFORMATION  www.smokefree.gov  Document Released: 04/24/2001 Document Revised: 10/30/2011 Document Reviewed: 08/09/2011 [employer] Patient Information 2014 [employer].  

## 2013-09-28 ENCOUNTER — Ambulatory Visit (INDEPENDENT_AMBULATORY_CARE_PROVIDER_SITE_OTHER): Payer: Medicaid Other | Admitting: Nurse Practitioner

## 2013-09-28 ENCOUNTER — Encounter: Payer: Self-pay | Admitting: Nurse Practitioner

## 2013-09-28 VITALS — BP 124/79 | HR 94 | Temp 98.5°F | Ht 67.0 in | Wt 196.0 lb

## 2013-09-28 DIAGNOSIS — J45901 Unspecified asthma with (acute) exacerbation: Secondary | ICD-10-CM

## 2013-09-28 MED ORDER — MONTELUKAST SODIUM 10 MG PO TABS: 10.0000 mg | ORAL_TABLET | Freq: Every day | ORAL | Status: DC

## 2013-09-28 NOTE — Progress Notes (Signed)
   Subjective:    Patient ID: Carloyn MannerLaura Hiatt, female    DOB: 12/24/1990, 23 y.o.   MRN: [id number]  HPI Patient here today for follow up - SHe was seen last week for hospital follow up and was given antibiotic , symbicort and steroid shot- She is some better but still coughing and wheezing at times.    Review of Systems  Constitutional: Negative for fever, chills and appetite change.  HENT: Positive for congestion.   Respiratory: Positive for cough and wheezing.   Cardiovascular: Negative.   Gastrointestinal: Negative.   Neurological: Negative.   Psychiatric/Behavioral: Negative.   All other systems reviewed and are negative.      Objective:   Physical Exam  Constitutional: She is oriented to person, place, and time. She appears well-developed and well-nourished.  Cardiovascular: Normal rate, regular rhythm and normal heart sounds.   Pulmonary/Chest: Effort normal. She has wheezes (faint expo wheezes).  Abdominal: Soft. Bowel sounds are normal.  Musculoskeletal: Normal range of motion.  Neurological: She is alert and oriented to person, place, and time.  Skin: Skin is warm and dry.  Psychiatric: She has a normal mood and affect. Her behavior is normal. Judgment and thought content normal.    BP 124/79  Pulse 94  Temp(Src) 98.5 F (36.9 C) (Oral)  Ht 5\' 7"  (1.702 m)  Wt 196 lb (88.905 kg)  BMI 30.69 kg/m2  LMP 09/19/2013       Assessment & Plan:   1. Asthmatic bronchitis with exacerbation    Finish antibiotic Finish prednisone Continue symbicort and clartin Add singulair NO smoking Follow up prn  Mary-Margaret Daphine DeutscherMartin, FNP

## 2013-09-28 NOTE — Patient Instructions (Signed)
Bronchospasm, Adult °A bronchospasm is when the tubes that carry air in and out of your lungs (airwarys) spasm or tighten. During a bronchospasm it is hard to breathe. This is because the airways get smaller. A bronchospasm can be triggered by: °· Allergies. These may be to animals, pollen, food, or mold. °· Infection. This is a common cause of bronchospasm. °· Exercise. °· Irritants. These include pollution, cigarette smoke, strong odors, aerosol sprays, and paint fumes. °· Weather changes. °· Stress. °· Being emotional. °HOME CARE  °· Always have a plan for getting help. Know when to call your doctor and local emergency services (911 in the [place] Know where you can get emergency care. °· Only take medicines as told by your doctor. °· If you were prescribed an inhaler or nebulizer machine, ask your doctor how to use it correctly. Always use a spacer with your inhaler if you were given one. °· Stay calm during an attack. Try to relax and breathe more slowly. °· Control your home environment: °· Change your heating and air conditioning filter at least once a month. °· Limit your use of fireplaces and wood stoves. °· Do not  smoke. Do not  allow smoking in your home. °· Avoid perfumes and fragrances. °· Get rid of pests (such as roaches and mice) and their droppings. °· Throw away plants if you see mold on them. °· Keep your house clean and dust free. °· Replace carpet with wood, tile, or vinyl flooring. Carpet can trap dander and dust. °· Use allergy-proof pillows, mattress covers, and box spring covers. °· Wash bed sheets and blankets every week in hot water. Dry them in a dryer. °· Use blankets that are made of polyester or cotton. °· Wash hands frequently. °GET HELP IF: °· You have muscle aches. °· You have chest pain. °· The thick spit you spit or cough up (sputum) changes from clear or white to yellow, green, gray, or bloody. °· The thick spit you spit or cough up gets thicker. °· There are problems that may be  related to the medicine you are given such as: °· A rash. °· Itching. °· Swelling. °· Trouble breathing. °GET HELP RIGHT AWAY IF: °· You feel you cannot breathe or catch your breath. °· You cannot stop coughing. °· Your treatment is not helping you breathe better. °MAKE SURE YOU:  °· Understand these instructions. °· Will watch your condition. °· Will get help right away if you are not doing well or get worse. °Document Released: 02/25/2009 Document Revised: 12/31/2012 Document Reviewed: 10/21/2012 °[employer]® Patient Information ©2014 [employer]. ° °

## 2013-11-10 ENCOUNTER — Ambulatory Visit: Payer: Medicaid Other

## 2013-11-17 ENCOUNTER — Ambulatory Visit: Payer: Medicaid Other

## 2014-01-04 ENCOUNTER — Other Ambulatory Visit: Payer: Self-pay | Admitting: Nurse Practitioner

## 2014-01-11 ENCOUNTER — Encounter: Payer: Self-pay | Admitting: Nurse Practitioner

## 2014-01-11 ENCOUNTER — Ambulatory Visit (INDEPENDENT_AMBULATORY_CARE_PROVIDER_SITE_OTHER): Payer: Medicaid Other | Admitting: Nurse Practitioner

## 2014-01-11 VITALS — BP 111/73 | HR 69 | Temp 97.9°F | Ht 67.0 in | Wt 192.0 lb

## 2014-01-11 DIAGNOSIS — F3289 Other specified depressive episodes: Secondary | ICD-10-CM

## 2014-01-11 DIAGNOSIS — N925 Other specified irregular menstruation: Secondary | ICD-10-CM

## 2014-01-11 DIAGNOSIS — F329 Major depressive disorder, single episode, unspecified: Secondary | ICD-10-CM

## 2014-01-11 DIAGNOSIS — F32A Depression, unspecified: Secondary | ICD-10-CM

## 2014-01-11 DIAGNOSIS — N949 Unspecified condition associated with female genital organs and menstrual cycle: Secondary | ICD-10-CM

## 2014-01-11 DIAGNOSIS — N926 Irregular menstruation, unspecified: Secondary | ICD-10-CM

## 2014-01-11 LAB — POCT URINE PREGNANCY: PREG TEST UR: NEGATIVE

## 2014-01-11 NOTE — Progress Notes (Signed)
   Subjective:    Patient ID: [name] Steele, female    DOB: 1990-11-06, 23 y.o.   MRN: [id number]  HPI Patient in today for refill on wellburtin- She is c/o of feeling real fatigued and nauseated- Her last period was 12/08/13 and was normal.    Review of Systems  Constitutional: Negative.   HENT: Negative.   Respiratory: Negative.   Cardiovascular: Negative.   Genitourinary: Negative.   Neurological: Negative.   Psychiatric/Behavioral: Negative.   All other systems reviewed and are negative.      Objective:   Physical Exam  Constitutional: She is oriented to person, place, and time. She appears well-developed and well-nourished.  Cardiovascular: Normal rate, regular rhythm and normal heart sounds.   Pulmonary/Chest: Effort normal and breath sounds normal.  Neurological: She is alert and oriented to person, place, and time.  Skin: Skin is warm and dry.  Psychiatric: She has a normal mood and affect. Her behavior is normal. Judgment and thought content normal.   BP 111/73  Pulse 69  Temp(Src) 97.9 F (36.6 C) (Oral)  Ht  (1.702 m)  Wt 192 lb (87.091 kg)  BMI 30.06 kg/m2   Results for orders placed in visit on 01/11/14  POCT URINE PREGNANCY      Result Value Ref Range   Preg Test, Ur Negative          Assessment & Plan:   1. Depression   2. Late menses    Patient said that urine pregnancy test did not show positive when prgenant with last child- wold like blood test Going to wait on those results before filling any meds Patient wants birth control if test is negative- wants the 3 month rx  Mary-Margaret Daphine Deutscher, FNP

## 2014-01-12 ENCOUNTER — Other Ambulatory Visit: Payer: Self-pay | Admitting: Nurse Practitioner

## 2014-01-12 DIAGNOSIS — F329 Major depressive disorder, single episode, unspecified: Secondary | ICD-10-CM

## 2014-01-12 DIAGNOSIS — F32A Depression, unspecified: Secondary | ICD-10-CM

## 2014-01-12 LAB — HCG, QUANTITATIVE, PREGNANCY: hCG Quant: <1 m[IU]/mL

## 2014-01-12 MED ORDER — BUPROPION HCL ER (XL) 300 MG PO TB24: 300.0000 mg | ORAL_TABLET | Freq: Every day | ORAL | Status: DC

## 2014-01-12 MED ORDER — LEVONORGEST-ETH ESTRAD 91-DAY 0.15-0.03 &0.01 MG PO TABS: 1.0000 | ORAL_TABLET | Freq: Every day | ORAL | Status: DC

## 2014-01-13 ENCOUNTER — Telehealth: Payer: Self-pay | Admitting: Family Medicine

## 2014-01-13 NOTE — Telephone Encounter (Signed)
Message copied by Azalee Course on Wed Jan 13, 2014  9:08 AM ------      Message from: Bennie Pierini      Created: Tue Jan 12, 2014 10:00 AM       Neg preg- seasonigque rx snet to pharmacy- do not start until the Sunday after menses starts      wellbutrin rx sent to pharmacy ------

## 2014-01-14 NOTE — Telephone Encounter (Signed)
Patient aware of lab results.  She is also aware 2 rx a pharmacy.  She was told not to begin seasonigque rx until the Sunday after her menses starts.  rs

## 2014-06-11 ENCOUNTER — Ambulatory Visit: Payer: Medicaid Other | Admitting: Nurse Practitioner

## 2014-08-10 ENCOUNTER — Encounter (HOSPITAL_COMMUNITY): Payer: Self-pay | Admitting: *Deleted

## 2014-08-10 ENCOUNTER — Inpatient Hospital Stay (HOSPITAL_COMMUNITY)
Admission: AD | Admit: 2014-08-10 | Discharge: 2014-08-10 | Discharge status: Against Medical Advice | Payer: Medicaid Other | Source: Ambulatory Visit | Attending: Obstetrics & Gynecology | Admitting: Obstetrics & Gynecology

## 2014-08-10 DIAGNOSIS — O9989 Other specified diseases and conditions complicating pregnancy, childbirth and the puerperium: Secondary | ICD-10-CM | POA: Insufficient documentation

## 2014-08-10 DIAGNOSIS — Z3A00 Weeks of gestation of pregnancy not specified: Secondary | ICD-10-CM | POA: Diagnosis not present

## 2014-08-10 DIAGNOSIS — R109 Unspecified abdominal pain: Secondary | ICD-10-CM | POA: Insufficient documentation

## 2014-08-10 LAB — URINALYSIS, ROUTINE W REFLEX MICROSCOPIC
Bilirubin Urine: NEGATIVE
GLUCOSE, UA: NEGATIVE mg/dL
Hgb urine dipstick: NEGATIVE
Ketones, ur: NEGATIVE mg/dL
LEUKOCYTES UA: NEGATIVE
NITRITE: NEGATIVE
PROTEIN: NEGATIVE mg/dL
Urobilinogen, UA: 0.2 mg/dL (ref 0.0–1.0)
pH: 6 (ref 5.0–8.0)

## 2014-08-10 NOTE — MAU Note (Signed)
PT BROUGHT  TO RM 1  FROM   [facility]-.    PT ASKED WHY WAS SHE IN ROOM-  BECAUSE  SHE WANTED  TO LEAVE- WITHOUT  BEING  SEEN BY PROVIDER-  SAYS  SHE HEARD  BABY'S FHR  AND  SHE HAD  NO PAIN-  SO  SHE WAS LEAVING .   SO SHE SIGNED AMA  FORMED.

## 2014-08-10 NOTE — MAU Note (Signed)
Ran into bathroom door this morning, didn't realize it was shut.  Feeling cramping across lower abd, tight when she walks.

## 2014-08-10 NOTE — MAU Note (Signed)
Patient presented to MAU on the evening of 08/10/14 and indicated she was a patient of "[name] Steele".  She was registered under [name] Steele.   24 yo G1P0 at 8018 6/7 weeks, with c/o of "Ran into bathroom door this morning, didn't realize it was shut. Feeling cramping across lower abd, tight when she walks" per Triage RN at 1756/1757, with FHR 161. Patient brought to Room 1 at 1927.  Per RN note:  PT BROUGHT TO RM 1 FROM [facility]-. PT ASKED WHY WAS SHE IN ROOM- BECAUSE SHE WANTED TO LEAVE- WITHOUT BEING SEEN BY PROVIDER- SAYS SHE HEARD [facility]ABY'S FHR AND SHE HAD NO PAIN- SO SHE WAS LEAVING . SO SHE SIGNED AMA FORMED.   No records of this patient in [facility] office records. She may be a patient of [facility] in [place], since she has a [place] address.  Note sent to [facility] office staff to investigate if patient has registered with [facility]. Until that is confirmed, she is not a patient of [facility].  [name] BridgemanVicki Raymont Steele, CNM 08/10/14 8:30p

## 2015-05-14 ENCOUNTER — Inpatient Hospital Stay (HOSPITAL_COMMUNITY): Admission: RE | Admit: 2015-05-14 | Payer: Medicaid Other | Source: Ambulatory Visit

## 2015-06-15 ENCOUNTER — Encounter (HOSPITAL_COMMUNITY): Payer: Self-pay | Admitting: *Deleted

## 2015-09-19 DIAGNOSIS — J0100 Acute maxillary sinusitis, unspecified: Secondary | ICD-10-CM | POA: Diagnosis not present

## 2015-09-19 DIAGNOSIS — J309 Allergic rhinitis, unspecified: Secondary | ICD-10-CM | POA: Diagnosis not present

## 2015-09-19 DIAGNOSIS — J209 Acute bronchitis, unspecified: Secondary | ICD-10-CM | POA: Diagnosis not present

## 2015-09-19 DIAGNOSIS — J4521 Mild intermittent asthma with (acute) exacerbation: Secondary | ICD-10-CM | POA: Diagnosis not present

## 2015-11-22 ENCOUNTER — Inpatient Hospital Stay (HOSPITAL_COMMUNITY)
Admission: AD | Admit: 2015-11-22 | Discharge: 2015-11-23 | Disposition: A | Discharge status: Home / Self Care | Payer: Medicaid Other | Source: Ambulatory Visit | Attending: Obstetrics & Gynecology | Admitting: Obstetrics & Gynecology

## 2015-11-22 ENCOUNTER — Inpatient Hospital Stay (HOSPITAL_COMMUNITY): Payer: Medicaid Other

## 2015-11-22 ENCOUNTER — Encounter (HOSPITAL_COMMUNITY): Payer: Self-pay | Admitting: *Deleted

## 2015-11-22 DIAGNOSIS — Z91018 Allergy to other foods: Secondary | ICD-10-CM | POA: Insufficient documentation

## 2015-11-22 DIAGNOSIS — F329 Major depressive disorder, single episode, unspecified: Secondary | ICD-10-CM | POA: Diagnosis not present

## 2015-11-22 DIAGNOSIS — R109 Unspecified abdominal pain: Secondary | ICD-10-CM

## 2015-11-22 DIAGNOSIS — F909 Attention-deficit hyperactivity disorder, unspecified type: Secondary | ICD-10-CM | POA: Insufficient documentation

## 2015-11-22 DIAGNOSIS — F419 Anxiety disorder, unspecified: Secondary | ICD-10-CM | POA: Insufficient documentation

## 2015-11-22 DIAGNOSIS — O210 Mild hyperemesis gravidarum: Secondary | ICD-10-CM | POA: Diagnosis not present

## 2015-11-22 DIAGNOSIS — F17200 Nicotine dependence, unspecified, uncomplicated: Secondary | ICD-10-CM | POA: Diagnosis not present

## 2015-11-22 DIAGNOSIS — O99331 Smoking (tobacco) complicating pregnancy, first trimester: Secondary | ICD-10-CM | POA: Diagnosis not present

## 2015-11-22 DIAGNOSIS — O99341 Other mental disorders complicating pregnancy, first trimester: Secondary | ICD-10-CM | POA: Diagnosis not present

## 2015-11-22 DIAGNOSIS — Z882 Allergy status to sulfonamides status: Secondary | ICD-10-CM | POA: Diagnosis not present

## 2015-11-22 DIAGNOSIS — O26851 Spotting complicating pregnancy, first trimester: Secondary | ICD-10-CM | POA: Diagnosis present

## 2015-11-22 DIAGNOSIS — Z880 Allergy status to penicillin: Secondary | ICD-10-CM | POA: Diagnosis not present

## 2015-11-22 DIAGNOSIS — Z3A08 8 weeks gestation of pregnancy: Secondary | ICD-10-CM | POA: Insufficient documentation

## 2015-11-22 DIAGNOSIS — O3680X0 Pregnancy with inconclusive fetal viability, not applicable or unspecified: Secondary | ICD-10-CM

## 2015-11-22 DIAGNOSIS — O209 Hemorrhage in early pregnancy, unspecified: Secondary | ICD-10-CM | POA: Insufficient documentation

## 2015-11-22 DIAGNOSIS — Z885 Allergy status to narcotic agent status: Secondary | ICD-10-CM | POA: Insufficient documentation

## 2015-11-22 DIAGNOSIS — O219 Vomiting of pregnancy, unspecified: Secondary | ICD-10-CM

## 2015-11-22 DIAGNOSIS — O26899 Other specified pregnancy related conditions, unspecified trimester: Secondary | ICD-10-CM

## 2015-11-22 LAB — WET PREP, GENITAL
CLUE CELLS WET PREP: NONE SEEN
Sperm: NONE SEEN
TRICH WET PREP: NONE SEEN
YEAST WET PREP: NONE SEEN

## 2015-11-22 LAB — POCT PREGNANCY, URINE: PREG TEST UR: POSITIVE — AB

## 2015-11-22 LAB — URINALYSIS, ROUTINE W REFLEX MICROSCOPIC
Bilirubin Urine: NEGATIVE
Glucose, UA: NEGATIVE mg/dL
HGB URINE DIPSTICK: NEGATIVE
Ketones, ur: NEGATIVE mg/dL
LEUKOCYTES UA: NEGATIVE
Nitrite: NEGATIVE
Protein, ur: NEGATIVE mg/dL
pH: 5.5 (ref 5.0–8.0)

## 2015-11-22 MED ORDER — METOCLOPRAMIDE HCL 5 MG/ML IJ SOLN
10.0000 mg | Freq: Once | INTRAMUSCULAR | Status: AC
  Administered 2015-11-22: 10 mg via INTRAVENOUS
  Filled 2015-11-22: qty 2

## 2015-11-22 MED ORDER — LACTATED RINGERS IV BOLUS (SEPSIS)
1000.0000 mL | Freq: Once | INTRAVENOUS | Status: AC
  Administered 2015-11-22: 1000 mL via INTRAVENOUS

## 2015-11-22 NOTE — MAU Note (Signed)
Pt presents complaining of lower abdominal cramping, vomiting since 4pm and spotting when she wiped all of which started today. +HPT. LMP 09/25/15. Denies abnormal discharge.
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
## 2015-11-23 DIAGNOSIS — O219 Vomiting of pregnancy, unspecified: Secondary | ICD-10-CM

## 2015-11-23 LAB — CBC WITH DIFFERENTIAL/PLATELET
Basophils Absolute: 0 10*3/uL (ref 0.0–0.1)
Basophils Relative: 0 %
EOS PCT: 1 %
Eosinophils Absolute: 0.1 10*3/uL (ref 0.0–0.7)
HCT: 36.2 % (ref 36.0–46.0)
Hemoglobin: 11.7 g/dL — ABNORMAL LOW (ref 12.0–15.0)
LYMPHS ABS: 2.6 10*3/uL (ref 0.7–4.0)
Lymphocytes Relative: 26 %
MCH: 29 pg (ref 26.0–34.0)
MCHC: 32.3 g/dL (ref 30.0–36.0)
MCV: 89.8 fL (ref 78.0–100.0)
MONOS PCT: 4 %
Monocytes Absolute: 0.4 10*3/uL (ref 0.1–1.0)
Neutro Abs: 6.8 10*3/uL (ref 1.7–7.7)
Neutrophils Relative %: 69 %
PLATELETS: 228 10*3/uL (ref 150–400)
RBC: 4.03 MIL/uL (ref 3.87–5.11)
RDW: 14.5 % (ref 11.5–15.5)
WBC: 10 10*3/uL (ref 4.0–10.5)

## 2015-11-23 LAB — ABO/RH: ABO/RH(D): A POS

## 2015-11-23 LAB — HIV ANTIBODY (ROUTINE TESTING W REFLEX): HIV SCREEN 4TH GENERATION: NONREACTIVE

## 2015-11-23 LAB — HCG, QUANTITATIVE, PREGNANCY: HCG, BETA CHAIN, QUANT, S: 4363 m[IU]/mL — AB (ref <5)

## 2015-11-23 LAB — RPR: RPR Ser Ql: NONREACTIVE

## 2015-11-23 LAB — GC/CHLAMYDIA PROBE AMP (~~LOC~~) NOT AT ARMC
Chlamydia: NEGATIVE
Neisseria Gonorrhea: NEGATIVE

## 2015-11-23 MED ORDER — PROMETHAZINE HCL 12.5 MG PO TABS: 12.5000 mg | ORAL_TABLET | Freq: Four times a day (QID) | ORAL | Status: AC | PRN

## 2015-11-23 NOTE — Discharge Instructions (Signed)
Subchorionic Hematoma A subchorionic hematoma is a gathering of blood between the outer wall of the placenta and the inner wall of the womb (uterus). The placenta is the organ that connects the fetus to the wall of the uterus. The placenta performs the feeding, breathing (oxygen to the fetus), and waste removal (excretory work) of the fetus.  Subchorionic hematoma is the most common abnormality found on a result from ultrasonography done during the first trimester or early second trimester of pregnancy. If there has been little or no vaginal bleeding, early small hematomas usually shrink on their own and do not affect your baby or pregnancy. The blood is gradually absorbed over 1-2 weeks. When bleeding starts later in pregnancy or the hematoma is larger or occurs in an older pregnant woman, the outcome may not be as good. Larger hematomas may get bigger, which increases the chances for miscarriage. Subchorionic hematoma also increases the risk of premature detachment of the placenta from the uterus, preterm (premature) labor, and stillbirth. HOME CARE INSTRUCTIONS  Stay on bed rest if your health care provider recommends this. Although bed rest will not prevent more bleeding or prevent a miscarriage, your health care provider may recommend bed rest until you are advised otherwise.  Avoid heavy lifting (more than 10 lb [4.5 kg]), exercise, sexual intercourse, or douching as directed by your health care provider.  Keep track of the number of pads you use each day and how soaked (saturated) they are. Write down this information.  Do not use tampons.  Keep all follow-up appointments as directed by your health care provider. Your health care provider may ask you to have follow-up blood tests or ultrasound tests or both. SEEK IMMEDIATE MEDICAL CARE IF:  You have severe cramps in your stomach, back, abdomen, or pelvis.  You have a fever.  You pass large clots or tissue. Save any tissue for your health  care provider to look at.  Your bleeding increases or you become lightheaded, feel weak, or have fainting episodes.   This information is not intended to replace advice given to you by your health care provider. Make sure you discuss any questions you have with your health care provider.   Document Released: 08/15/2006 Document Revised: 05/21/2014 Document Reviewed: 11/27/2012 Elsevier Interactive Patient Education 2016 Elsevier Inc. Morning Sickness Morning sickness is when you feel sick to your stomach (nauseous) during pregnancy. You may feel sick to your stomach and throw up (vomit). You may feel sick in the morning, but you can feel this way any time of day. Some women feel very sick to their stomach and cannot stop throwing up (hyperemesis gravidarum). HOME CARE  Only take medicines as told by your doctor.  Take multivitamins as told by your doctor. Taking multivitamins before getting pregnant can stop or lessen the harshness of morning sickness.  Eat dry toast or unsalted crackers before getting out of bed.  Eat 5 to 6 small meals a day.  Eat dry and bland foods like rice and baked potatoes.  Do not drink liquids with meals. Drink between meals.  Do not eat greasy, fatty, or spicy foods.  Have someone cook for you if the smell of food causes you to feel sick or throw up.  If you feel sick to your stomach after taking prenatal vitamins, take them at night or with a snack.  Eat protein when you need a snack (nuts, yogurt, cheese).  Eat unsweetened gelatins for dessert.  Wear a bracelet used for sea sickness (  acupressure wristband).  Go to a doctor that puts thin needles into certain body points (acupuncture) to improve how you feel.  Do not smoke.  Use a humidifier to keep the air in your house free of odors.  Get lots of fresh air. GET HELP IF:  You need medicine to feel better.  You feel dizzy or lightheaded.  You are losing weight. GET HELP RIGHT AWAY IF:     You feel very sick to your stomach and cannot stop throwing up.  You pass out (faint). MAKE SURE YOU:  Understand these instructions.  Will watch your condition.  Will get help right away if you are not doing well or get worse.   This information is not intended to replace advice given to you by your health care provider. Make sure you discuss any questions you have with your health care provider.   Document Released: 06/07/2004 Document Revised: 05/21/2014 Document Reviewed: 10/15/2012 Elsevier Interactive Patient Education [employer]2016 Elsevier Inc.

## 2015-11-25 ENCOUNTER — Other Ambulatory Visit: Payer: Self-pay

## 2015-11-25 DIAGNOSIS — O3680X0 Pregnancy with inconclusive fetal viability, not applicable or unspecified: Secondary | ICD-10-CM

## 2015-11-25 LAB — HCG, QUANTITATIVE, PREGNANCY: HCG, BETA CHAIN, QUANT, S: 8122 m[IU]/mL — AB (ref <5)

## 2015-12-05 DIAGNOSIS — Z3491 Encounter for supervision of normal pregnancy, unspecified, first trimester: Secondary | ICD-10-CM | POA: Diagnosis not present

## 2015-12-05 DIAGNOSIS — Z36 Encounter for antenatal screening of mother: Secondary | ICD-10-CM | POA: Diagnosis not present

## 2015-12-05 DIAGNOSIS — Z3A01 Less than 8 weeks gestation of pregnancy: Secondary | ICD-10-CM | POA: Diagnosis not present

## 2016-01-13 DIAGNOSIS — Z36 Encounter for antenatal screening of mother: Secondary | ICD-10-CM | POA: Diagnosis not present

## 2016-01-13 DIAGNOSIS — Z3A12 12 weeks gestation of pregnancy: Secondary | ICD-10-CM | POA: Diagnosis not present

## 2016-01-13 DIAGNOSIS — O43891 Other placental disorders, first trimester: Secondary | ICD-10-CM | POA: Diagnosis not present

## 2016-01-13 DIAGNOSIS — Z8759 Personal history of other complications of pregnancy, childbirth and the puerperium: Secondary | ICD-10-CM | POA: Diagnosis not present

## 2016-02-14 DIAGNOSIS — J0100 Acute maxillary sinusitis, unspecified: Secondary | ICD-10-CM | POA: Diagnosis not present

## 2016-02-28 DIAGNOSIS — Z23 Encounter for immunization: Secondary | ICD-10-CM | POA: Diagnosis not present

## 2016-02-28 DIAGNOSIS — Z3482 Encounter for supervision of other normal pregnancy, second trimester: Secondary | ICD-10-CM | POA: Diagnosis not present

## 2016-02-28 DIAGNOSIS — Z3A00 Weeks of gestation of pregnancy not specified: Secondary | ICD-10-CM | POA: Diagnosis not present

## 2016-02-28 DIAGNOSIS — O281 Abnormal biochemical finding on antenatal screening of mother: Secondary | ICD-10-CM | POA: Diagnosis not present

## 2016-02-28 DIAGNOSIS — Z3A19 19 weeks gestation of pregnancy: Secondary | ICD-10-CM | POA: Diagnosis not present

## 2016-03-08 ENCOUNTER — Encounter (HOSPITAL_COMMUNITY): Payer: Self-pay | Admitting: Obstetrics and Gynecology

## 2016-03-08 ENCOUNTER — Other Ambulatory Visit (HOSPITAL_COMMUNITY): Payer: Self-pay | Admitting: Obstetrics and Gynecology

## 2016-03-08 DIAGNOSIS — Z3689 Encounter for other specified antenatal screening: Secondary | ICD-10-CM

## 2016-03-08 DIAGNOSIS — Z3A22 22 weeks gestation of pregnancy: Secondary | ICD-10-CM

## 2016-03-08 DIAGNOSIS — O280 Abnormal hematological finding on antenatal screening of mother: Secondary | ICD-10-CM

## 2016-03-14 ENCOUNTER — Encounter (HOSPITAL_COMMUNITY): Payer: Self-pay

## 2016-03-15 ENCOUNTER — Other Ambulatory Visit (HOSPITAL_COMMUNITY): Payer: Self-pay | Admitting: Obstetrics and Gynecology

## 2016-03-15 ENCOUNTER — Encounter (HOSPITAL_COMMUNITY): Payer: Self-pay

## 2016-03-15 ENCOUNTER — Ambulatory Visit (HOSPITAL_COMMUNITY): Admission: RE | Admit: 2016-03-15 | Payer: Medicaid Other | Source: Ambulatory Visit

## 2016-03-15 ENCOUNTER — Ambulatory Visit (HOSPITAL_COMMUNITY)
Admission: RE | Admit: 2016-03-15 | Discharge: 2016-03-15 | Disposition: A | Discharge status: Home / Self Care | Payer: Medicaid Other | Source: Ambulatory Visit | Attending: Obstetrics and Gynecology | Admitting: Obstetrics and Gynecology

## 2016-03-15 DIAGNOSIS — E669 Obesity, unspecified: Secondary | ICD-10-CM | POA: Diagnosis not present

## 2016-03-15 DIAGNOSIS — Z363 Encounter for antenatal screening for malformations: Secondary | ICD-10-CM | POA: Diagnosis not present

## 2016-03-15 DIAGNOSIS — O280 Abnormal hematological finding on antenatal screening of mother: Secondary | ICD-10-CM

## 2016-03-15 DIAGNOSIS — O283 Abnormal ultrasonic finding on antenatal screening of mother: Secondary | ICD-10-CM | POA: Diagnosis not present

## 2016-03-15 DIAGNOSIS — Z3689 Encounter for other specified antenatal screening: Secondary | ICD-10-CM

## 2016-03-15 DIAGNOSIS — Z3A21 21 weeks gestation of pregnancy: Secondary | ICD-10-CM | POA: Diagnosis not present

## 2016-03-15 DIAGNOSIS — O99212 Obesity complicating pregnancy, second trimester: Secondary | ICD-10-CM | POA: Diagnosis not present

## 2016-03-15 DIAGNOSIS — Z3A22 22 weeks gestation of pregnancy: Secondary | ICD-10-CM

## 2016-03-15 DIAGNOSIS — O34219 Maternal care for unspecified type scar from previous cesarean delivery: Secondary | ICD-10-CM | POA: Insufficient documentation

## 2016-03-15 DIAGNOSIS — O365120 Maternal care for known or suspected placental insufficiency, second trimester, not applicable or unspecified: Secondary | ICD-10-CM | POA: Diagnosis not present

## 2016-03-15 HISTORY — DX: Unspecified asthma, uncomplicated: J45.909

## 2016-03-21 ENCOUNTER — Ambulatory Visit (HOSPITAL_COMMUNITY): Payer: Self-pay

## 2016-03-21 ENCOUNTER — Encounter (HOSPITAL_COMMUNITY): Payer: Self-pay

## 2016-04-04 DIAGNOSIS — J45909 Unspecified asthma, uncomplicated: Secondary | ICD-10-CM | POA: Diagnosis not present

## 2016-04-04 DIAGNOSIS — Z7982 Long term (current) use of aspirin: Secondary | ICD-10-CM | POA: Diagnosis not present

## 2016-04-04 DIAGNOSIS — O365990 Maternal care for other known or suspected poor fetal growth, unspecified trimester, not applicable or unspecified: Secondary | ICD-10-CM | POA: Diagnosis not present

## 2016-04-04 DIAGNOSIS — Z3689 Encounter for other specified antenatal screening: Secondary | ICD-10-CM | POA: Diagnosis not present

## 2016-04-04 DIAGNOSIS — Z3A24 24 weeks gestation of pregnancy: Secondary | ICD-10-CM | POA: Diagnosis not present

## 2016-04-04 DIAGNOSIS — O34219 Maternal care for unspecified type scar from previous cesarean delivery: Secondary | ICD-10-CM | POA: Diagnosis not present

## 2016-04-04 DIAGNOSIS — O99512 Diseases of the respiratory system complicating pregnancy, second trimester: Secondary | ICD-10-CM | POA: Diagnosis not present

## 2016-04-04 DIAGNOSIS — Z79899 Other long term (current) drug therapy: Secondary | ICD-10-CM | POA: Diagnosis not present

## 2016-04-04 DIAGNOSIS — O99342 Other mental disorders complicating pregnancy, second trimester: Secondary | ICD-10-CM | POA: Diagnosis not present

## 2016-04-04 DIAGNOSIS — Z362 Encounter for other antenatal screening follow-up: Secondary | ICD-10-CM | POA: Diagnosis not present

## 2016-04-04 DIAGNOSIS — O365920 Maternal care for other known or suspected poor fetal growth, second trimester, not applicable or unspecified: Secondary | ICD-10-CM | POA: Diagnosis not present

## 2016-04-04 DIAGNOSIS — O365930 Maternal care for other known or suspected poor fetal growth, third trimester, not applicable or unspecified: Secondary | ICD-10-CM | POA: Diagnosis not present

## 2016-04-04 DIAGNOSIS — O281 Abnormal biochemical finding on antenatal screening of mother: Secondary | ICD-10-CM | POA: Diagnosis not present

## 2016-04-04 DIAGNOSIS — F329 Major depressive disorder, single episode, unspecified: Secondary | ICD-10-CM | POA: Diagnosis not present

## 2016-04-20 DIAGNOSIS — O365920 Maternal care for other known or suspected poor fetal growth, second trimester, not applicable or unspecified: Secondary | ICD-10-CM | POA: Diagnosis not present

## 2016-04-20 DIAGNOSIS — O34219 Maternal care for unspecified type scar from previous cesarean delivery: Secondary | ICD-10-CM | POA: Diagnosis not present

## 2016-04-20 DIAGNOSIS — O281 Abnormal biochemical finding on antenatal screening of mother: Secondary | ICD-10-CM | POA: Diagnosis not present

## 2016-04-20 DIAGNOSIS — O365990 Maternal care for other known or suspected poor fetal growth, unspecified trimester, not applicable or unspecified: Secondary | ICD-10-CM | POA: Diagnosis not present

## 2016-04-20 DIAGNOSIS — Z362 Encounter for other antenatal screening follow-up: Secondary | ICD-10-CM | POA: Diagnosis not present

## 2016-04-20 DIAGNOSIS — Z3A26 26 weeks gestation of pregnancy: Secondary | ICD-10-CM | POA: Diagnosis not present

## 2016-04-27 DIAGNOSIS — O281 Abnormal biochemical finding on antenatal screening of mother: Secondary | ICD-10-CM | POA: Diagnosis not present

## 2016-04-27 DIAGNOSIS — O34219 Maternal care for unspecified type scar from previous cesarean delivery: Secondary | ICD-10-CM | POA: Diagnosis not present

## 2016-04-27 DIAGNOSIS — Z3689 Encounter for other specified antenatal screening: Secondary | ICD-10-CM | POA: Diagnosis not present

## 2016-04-27 DIAGNOSIS — Z3A27 27 weeks gestation of pregnancy: Secondary | ICD-10-CM | POA: Diagnosis not present

## 2016-04-27 DIAGNOSIS — O365990 Maternal care for other known or suspected poor fetal growth, unspecified trimester, not applicable or unspecified: Secondary | ICD-10-CM | POA: Diagnosis not present

## 2016-04-27 DIAGNOSIS — O365920 Maternal care for other known or suspected poor fetal growth, second trimester, not applicable or unspecified: Secondary | ICD-10-CM | POA: Diagnosis not present

## 2016-05-04 DIAGNOSIS — O281 Abnormal biochemical finding on antenatal screening of mother: Secondary | ICD-10-CM | POA: Diagnosis not present

## 2016-05-04 DIAGNOSIS — Z3689 Encounter for other specified antenatal screening: Secondary | ICD-10-CM | POA: Diagnosis not present

## 2016-05-04 DIAGNOSIS — O365930 Maternal care for other known or suspected poor fetal growth, third trimester, not applicable or unspecified: Secondary | ICD-10-CM | POA: Diagnosis not present

## 2016-05-04 DIAGNOSIS — O365990 Maternal care for other known or suspected poor fetal growth, unspecified trimester, not applicable or unspecified: Secondary | ICD-10-CM | POA: Diagnosis not present

## 2016-05-04 DIAGNOSIS — O34219 Maternal care for unspecified type scar from previous cesarean delivery: Secondary | ICD-10-CM | POA: Diagnosis not present

## 2016-05-11 DIAGNOSIS — Z3A29 29 weeks gestation of pregnancy: Secondary | ICD-10-CM | POA: Diagnosis not present

## 2016-05-11 DIAGNOSIS — F329 Major depressive disorder, single episode, unspecified: Secondary | ICD-10-CM | POA: Diagnosis not present

## 2016-05-11 DIAGNOSIS — O365930 Maternal care for other known or suspected poor fetal growth, third trimester, not applicable or unspecified: Secondary | ICD-10-CM | POA: Diagnosis not present

## 2016-05-11 DIAGNOSIS — O99353 Diseases of the nervous system complicating pregnancy, third trimester: Secondary | ICD-10-CM | POA: Diagnosis not present

## 2016-05-11 DIAGNOSIS — Z23 Encounter for immunization: Secondary | ICD-10-CM | POA: Diagnosis not present

## 2016-05-11 DIAGNOSIS — O34219 Maternal care for unspecified type scar from previous cesarean delivery: Secondary | ICD-10-CM | POA: Diagnosis not present

## 2016-05-11 DIAGNOSIS — Z362 Encounter for other antenatal screening follow-up: Secondary | ICD-10-CM | POA: Diagnosis not present

## 2016-05-11 DIAGNOSIS — O281 Abnormal biochemical finding on antenatal screening of mother: Secondary | ICD-10-CM | POA: Diagnosis not present

## 2016-05-11 DIAGNOSIS — G43909 Migraine, unspecified, not intractable, without status migrainosus: Secondary | ICD-10-CM | POA: Diagnosis not present

## 2016-05-11 DIAGNOSIS — F902 Attention-deficit hyperactivity disorder, combined type: Secondary | ICD-10-CM | POA: Diagnosis not present

## 2016-05-11 DIAGNOSIS — O99343 Other mental disorders complicating pregnancy, third trimester: Secondary | ICD-10-CM | POA: Diagnosis not present

## 2016-06-01 DIAGNOSIS — O365930 Maternal care for other known or suspected poor fetal growth, third trimester, not applicable or unspecified: Secondary | ICD-10-CM | POA: Diagnosis not present

## 2016-06-01 DIAGNOSIS — G43909 Migraine, unspecified, not intractable, without status migrainosus: Secondary | ICD-10-CM | POA: Diagnosis not present

## 2016-06-01 DIAGNOSIS — O281 Abnormal biochemical finding on antenatal screening of mother: Secondary | ICD-10-CM | POA: Diagnosis not present

## 2016-06-01 DIAGNOSIS — Z362 Encounter for other antenatal screening follow-up: Secondary | ICD-10-CM | POA: Diagnosis not present

## 2016-06-01 DIAGNOSIS — O99353 Diseases of the nervous system complicating pregnancy, third trimester: Secondary | ICD-10-CM | POA: Diagnosis not present

## 2016-06-01 DIAGNOSIS — O0993 Supervision of high risk pregnancy, unspecified, third trimester: Secondary | ICD-10-CM | POA: Diagnosis not present

## 2016-06-01 DIAGNOSIS — O34219 Maternal care for unspecified type scar from previous cesarean delivery: Secondary | ICD-10-CM | POA: Diagnosis not present

## 2016-06-01 DIAGNOSIS — O365990 Maternal care for other known or suspected poor fetal growth, unspecified trimester, not applicable or unspecified: Secondary | ICD-10-CM | POA: Diagnosis not present

## 2016-06-01 DIAGNOSIS — Z3A32 32 weeks gestation of pregnancy: Secondary | ICD-10-CM | POA: Diagnosis not present

## 2016-06-01 DIAGNOSIS — O99343 Other mental disorders complicating pregnancy, third trimester: Secondary | ICD-10-CM | POA: Diagnosis not present

## 2016-06-01 DIAGNOSIS — F902 Attention-deficit hyperactivity disorder, combined type: Secondary | ICD-10-CM | POA: Diagnosis not present

## 2016-06-04 DIAGNOSIS — O365990 Maternal care for other known or suspected poor fetal growth, unspecified trimester, not applicable or unspecified: Secondary | ICD-10-CM | POA: Diagnosis not present

## 2016-06-04 DIAGNOSIS — F902 Attention-deficit hyperactivity disorder, combined type: Secondary | ICD-10-CM | POA: Diagnosis not present

## 2016-06-04 DIAGNOSIS — O99353 Diseases of the nervous system complicating pregnancy, third trimester: Secondary | ICD-10-CM | POA: Diagnosis not present

## 2016-06-04 DIAGNOSIS — G43909 Migraine, unspecified, not intractable, without status migrainosus: Secondary | ICD-10-CM | POA: Diagnosis not present

## 2016-06-04 DIAGNOSIS — O99343 Other mental disorders complicating pregnancy, third trimester: Secondary | ICD-10-CM | POA: Diagnosis not present

## 2016-06-04 DIAGNOSIS — O0993 Supervision of high risk pregnancy, unspecified, third trimester: Secondary | ICD-10-CM | POA: Diagnosis not present

## 2016-06-04 DIAGNOSIS — Z3A32 32 weeks gestation of pregnancy: Secondary | ICD-10-CM | POA: Diagnosis not present

## 2016-06-06 DIAGNOSIS — O99344 Other mental disorders complicating childbirth: Secondary | ICD-10-CM | POA: Diagnosis not present

## 2016-06-06 DIAGNOSIS — Z3A33 33 weeks gestation of pregnancy: Secondary | ICD-10-CM | POA: Diagnosis not present

## 2016-06-06 DIAGNOSIS — Z880 Allergy status to penicillin: Secondary | ICD-10-CM | POA: Diagnosis not present

## 2016-06-06 DIAGNOSIS — Z79899 Other long term (current) drug therapy: Secondary | ICD-10-CM | POA: Diagnosis not present

## 2016-06-06 DIAGNOSIS — Z8659 Personal history of other mental and behavioral disorders: Secondary | ICD-10-CM | POA: Diagnosis not present

## 2016-06-06 DIAGNOSIS — O365930 Maternal care for other known or suspected poor fetal growth, third trimester, not applicable or unspecified: Secondary | ICD-10-CM | POA: Diagnosis not present

## 2016-06-06 DIAGNOSIS — Z885 Allergy status to narcotic agent status: Secondary | ICD-10-CM | POA: Diagnosis not present

## 2016-06-06 DIAGNOSIS — O34219 Maternal care for unspecified type scar from previous cesarean delivery: Secondary | ICD-10-CM | POA: Diagnosis not present

## 2016-06-06 DIAGNOSIS — O1493 Unspecified pre-eclampsia, third trimester: Secondary | ICD-10-CM | POA: Diagnosis not present

## 2016-06-06 DIAGNOSIS — Z7982 Long term (current) use of aspirin: Secondary | ICD-10-CM | POA: Diagnosis not present

## 2016-06-06 DIAGNOSIS — O34211 Maternal care for low transverse scar from previous cesarean delivery: Secondary | ICD-10-CM | POA: Diagnosis not present

## 2016-06-06 DIAGNOSIS — Z882 Allergy status to sulfonamides status: Secondary | ICD-10-CM | POA: Diagnosis not present

## 2016-06-06 DIAGNOSIS — O281 Abnormal biochemical finding on antenatal screening of mother: Secondary | ICD-10-CM | POA: Diagnosis not present

## 2016-06-06 DIAGNOSIS — O1403 Mild to moderate pre-eclampsia, third trimester: Secondary | ICD-10-CM | POA: Diagnosis not present

## 2016-06-07 DIAGNOSIS — Z3A33 33 weeks gestation of pregnancy: Secondary | ICD-10-CM | POA: Diagnosis not present

## 2016-06-07 DIAGNOSIS — O34211 Maternal care for low transverse scar from previous cesarean delivery: Secondary | ICD-10-CM | POA: Diagnosis not present

## 2016-06-07 DIAGNOSIS — O1403 Mild to moderate pre-eclampsia, third trimester: Secondary | ICD-10-CM | POA: Diagnosis not present

## 2016-06-07 DIAGNOSIS — O99344 Other mental disorders complicating childbirth: Secondary | ICD-10-CM | POA: Diagnosis not present

## 2016-06-08 DIAGNOSIS — O99344 Other mental disorders complicating childbirth: Secondary | ICD-10-CM | POA: Diagnosis not present

## 2016-06-08 DIAGNOSIS — O281 Abnormal biochemical finding on antenatal screening of mother: Secondary | ICD-10-CM | POA: Diagnosis not present

## 2016-06-08 DIAGNOSIS — O365930 Maternal care for other known or suspected poor fetal growth, third trimester, not applicable or unspecified: Secondary | ICD-10-CM | POA: Diagnosis not present

## 2016-06-08 DIAGNOSIS — O34219 Maternal care for unspecified type scar from previous cesarean delivery: Secondary | ICD-10-CM | POA: Diagnosis not present

## 2016-06-08 DIAGNOSIS — Z3A33 33 weeks gestation of pregnancy: Secondary | ICD-10-CM | POA: Diagnosis not present

## 2016-06-08 DIAGNOSIS — O1403 Mild to moderate pre-eclampsia, third trimester: Secondary | ICD-10-CM | POA: Diagnosis not present

## 2016-06-08 DIAGNOSIS — O34211 Maternal care for low transverse scar from previous cesarean delivery: Secondary | ICD-10-CM | POA: Diagnosis not present

## 2016-06-09 DIAGNOSIS — O1403 Mild to moderate pre-eclampsia, third trimester: Secondary | ICD-10-CM | POA: Diagnosis not present

## 2016-06-09 DIAGNOSIS — O34211 Maternal care for low transverse scar from previous cesarean delivery: Secondary | ICD-10-CM | POA: Diagnosis not present

## 2016-06-09 DIAGNOSIS — O99344 Other mental disorders complicating childbirth: Secondary | ICD-10-CM | POA: Diagnosis not present

## 2016-06-09 DIAGNOSIS — Z3A33 33 weeks gestation of pregnancy: Secondary | ICD-10-CM | POA: Diagnosis not present

## 2016-06-13 DIAGNOSIS — O1423 HELLP syndrome (HELLP), third trimester: Secondary | ICD-10-CM | POA: Diagnosis not present

## 2016-06-13 DIAGNOSIS — Z3A34 34 weeks gestation of pregnancy: Secondary | ICD-10-CM | POA: Diagnosis not present

## 2016-06-13 DIAGNOSIS — O1410 Severe pre-eclampsia, unspecified trimester: Secondary | ICD-10-CM | POA: Diagnosis not present

## 2016-06-13 DIAGNOSIS — O34219 Maternal care for unspecified type scar from previous cesarean delivery: Secondary | ICD-10-CM | POA: Diagnosis not present

## 2016-06-14 DIAGNOSIS — O1414 Severe pre-eclampsia complicating childbirth: Secondary | ICD-10-CM | POA: Diagnosis not present

## 2016-06-14 DIAGNOSIS — O43893 Other placental disorders, third trimester: Secondary | ICD-10-CM | POA: Diagnosis not present

## 2016-06-14 DIAGNOSIS — F329 Major depressive disorder, single episode, unspecified: Secondary | ICD-10-CM | POA: Diagnosis not present

## 2016-06-14 DIAGNOSIS — O34211 Maternal care for low transverse scar from previous cesarean delivery: Secondary | ICD-10-CM | POA: Diagnosis not present

## 2016-06-14 DIAGNOSIS — Z880 Allergy status to penicillin: Secondary | ICD-10-CM | POA: Diagnosis not present

## 2016-06-14 DIAGNOSIS — O99214 Obesity complicating childbirth: Secondary | ICD-10-CM | POA: Diagnosis not present

## 2016-06-14 DIAGNOSIS — O1493 Unspecified pre-eclampsia, third trimester: Secondary | ICD-10-CM | POA: Diagnosis not present

## 2016-06-14 DIAGNOSIS — O365930 Maternal care for other known or suspected poor fetal growth, third trimester, not applicable or unspecified: Secondary | ICD-10-CM | POA: Diagnosis not present

## 2016-06-14 DIAGNOSIS — O1424 HELLP syndrome, complicating childbirth: Secondary | ICD-10-CM | POA: Diagnosis not present

## 2016-06-14 DIAGNOSIS — Z7982 Long term (current) use of aspirin: Secondary | ICD-10-CM | POA: Diagnosis not present

## 2016-06-14 DIAGNOSIS — Z882 Allergy status to sulfonamides status: Secondary | ICD-10-CM | POA: Diagnosis not present

## 2016-06-14 DIAGNOSIS — J45909 Unspecified asthma, uncomplicated: Secondary | ICD-10-CM | POA: Diagnosis not present

## 2016-06-14 DIAGNOSIS — O321XX0 Maternal care for breech presentation, not applicable or unspecified: Secondary | ICD-10-CM | POA: Diagnosis not present

## 2016-06-14 DIAGNOSIS — Z91018 Allergy to other foods: Secondary | ICD-10-CM | POA: Diagnosis not present

## 2016-06-14 DIAGNOSIS — O34219 Maternal care for unspecified type scar from previous cesarean delivery: Secondary | ICD-10-CM | POA: Diagnosis not present

## 2016-06-14 DIAGNOSIS — Z885 Allergy status to narcotic agent status: Secondary | ICD-10-CM | POA: Diagnosis not present

## 2016-06-14 DIAGNOSIS — Z3A34 34 weeks gestation of pregnancy: Secondary | ICD-10-CM | POA: Diagnosis not present

## 2016-07-31 DIAGNOSIS — Z8659 Personal history of other mental and behavioral disorders: Secondary | ICD-10-CM | POA: Diagnosis not present

## 2016-07-31 DIAGNOSIS — F53 Puerperal psychosis: Secondary | ICD-10-CM | POA: Diagnosis not present

## 2016-07-31 DIAGNOSIS — E669 Obesity, unspecified: Secondary | ICD-10-CM | POA: Diagnosis not present

## 2016-07-31 DIAGNOSIS — J4520 Mild intermittent asthma, uncomplicated: Secondary | ICD-10-CM | POA: Diagnosis not present

## 2016-07-31 DIAGNOSIS — Z30017 Encounter for initial prescription of implantable subdermal contraceptive: Secondary | ICD-10-CM | POA: Diagnosis not present

## 2016-08-14 DIAGNOSIS — R5383 Other fatigue: Secondary | ICD-10-CM | POA: Diagnosis not present

## 2016-08-14 DIAGNOSIS — Z136 Encounter for screening for cardiovascular disorders: Secondary | ICD-10-CM | POA: Diagnosis not present

## 2016-08-14 DIAGNOSIS — Z6835 Body mass index (BMI) 35.0-35.9, adult: Secondary | ICD-10-CM | POA: Diagnosis not present

## 2016-08-14 DIAGNOSIS — F339 Major depressive disorder, recurrent, unspecified: Secondary | ICD-10-CM | POA: Diagnosis not present

## 2016-09-26 ENCOUNTER — Encounter (HOSPITAL_COMMUNITY): Payer: Self-pay

## 2016-11-01 DIAGNOSIS — E669 Obesity, unspecified: Secondary | ICD-10-CM | POA: Diagnosis not present

## 2016-11-01 DIAGNOSIS — F339 Major depressive disorder, recurrent, unspecified: Secondary | ICD-10-CM | POA: Diagnosis not present

## 2016-11-01 DIAGNOSIS — F419 Anxiety disorder, unspecified: Secondary | ICD-10-CM | POA: Diagnosis not present

## 2016-11-01 DIAGNOSIS — G43119 Migraine with aura, intractable, without status migrainosus: Secondary | ICD-10-CM | POA: Diagnosis not present

## 2016-11-01 DIAGNOSIS — Z6834 Body mass index (BMI) 34.0-34.9, adult: Secondary | ICD-10-CM | POA: Diagnosis not present

## 2016-11-29 ENCOUNTER — Telehealth: Payer: Self-pay | Admitting: *Deleted

## 2016-11-29 ENCOUNTER — Ambulatory Visit: Payer: Medicaid Other | Admitting: Neurology

## 2016-11-29 NOTE — Telephone Encounter (Signed)
No showed her new patient appointment.

## 2016-11-30 ENCOUNTER — Encounter: Payer: Self-pay | Admitting: Neurology

## 2017-01-18 IMAGING — US US OB TRANSVAGINAL
1 series · 15 of 28 positions shown · non-contrast
Comparison: None.

CLINICAL DATA: Lower abdominal cramping, spotting today.
Gestational age by last menstrual period 8 weeks and 2 days. G2 P1.

EXAM:
OBSTETRIC <14 WK ULTRASOUND
TECHNIQUE: Transabdominal ultrasound was performed for evaluation of the
gestation as well as the maternal uterus and adnexal regions.

[Series 1: us ob transvaginal · 58 acquisitions, 15 frames shown]
[im 1/58]
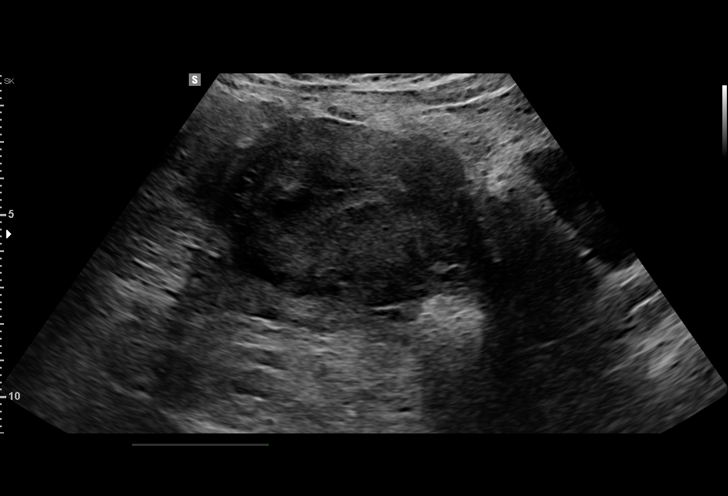
[im 5/58]
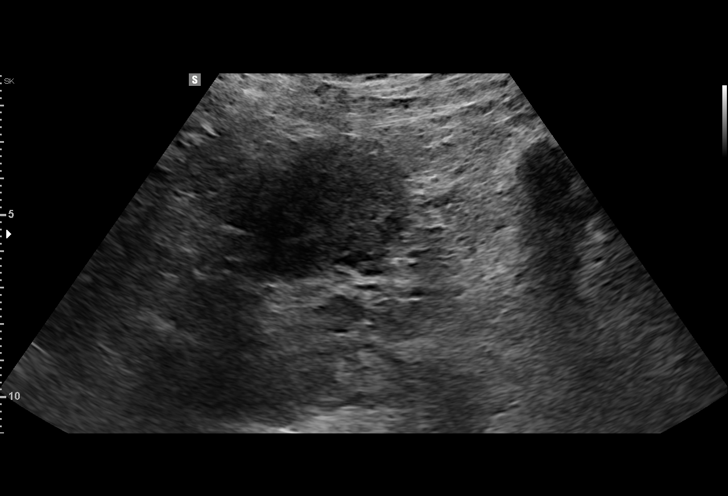
[im 9/58]
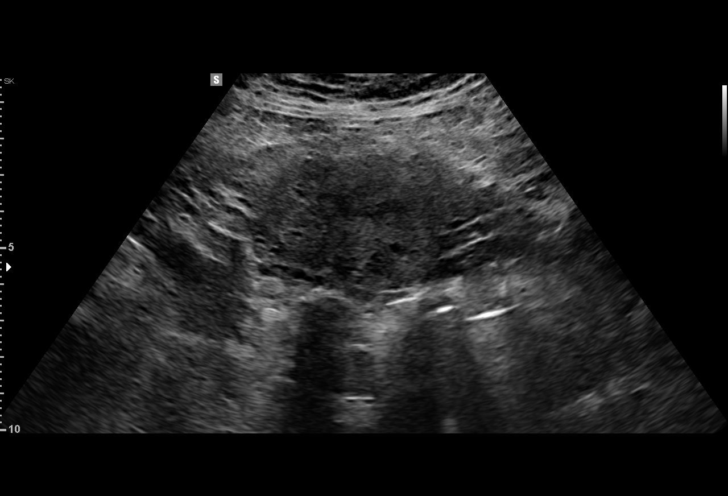
[im 13/58]
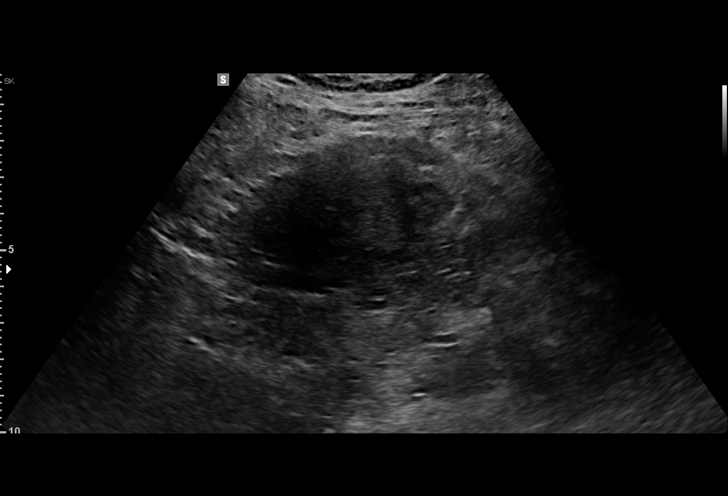
[im 17/58]
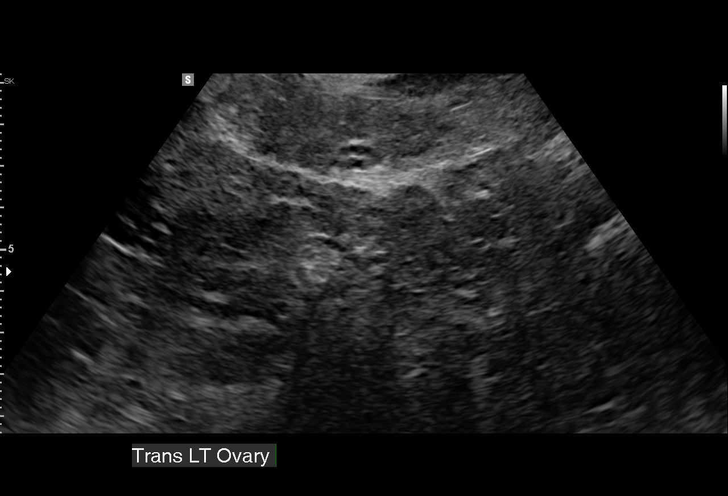
[im 22/58]
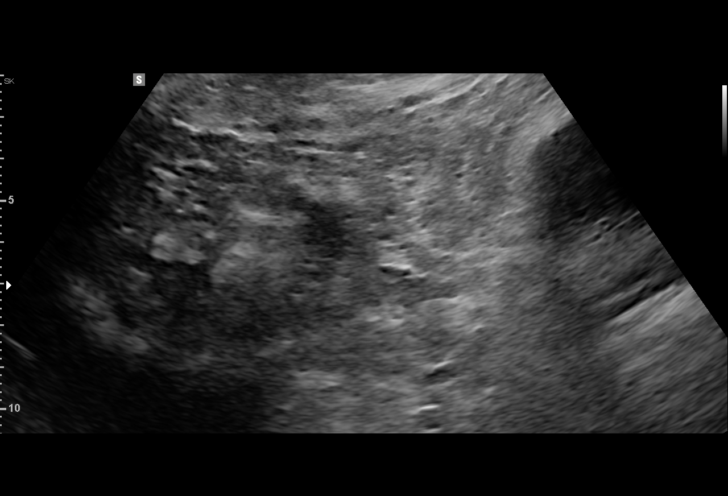
[im 26/58]
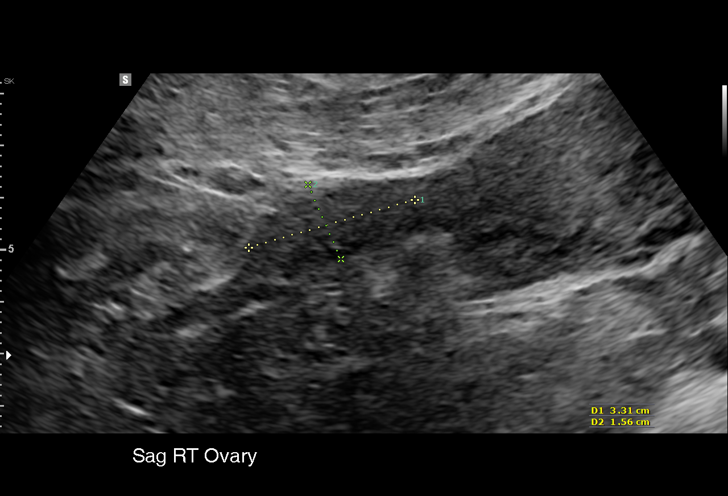
[im 30/58]
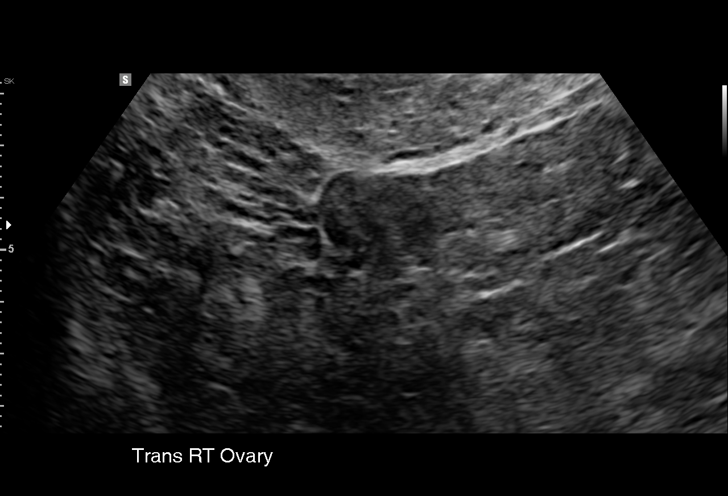
[im 32/58]
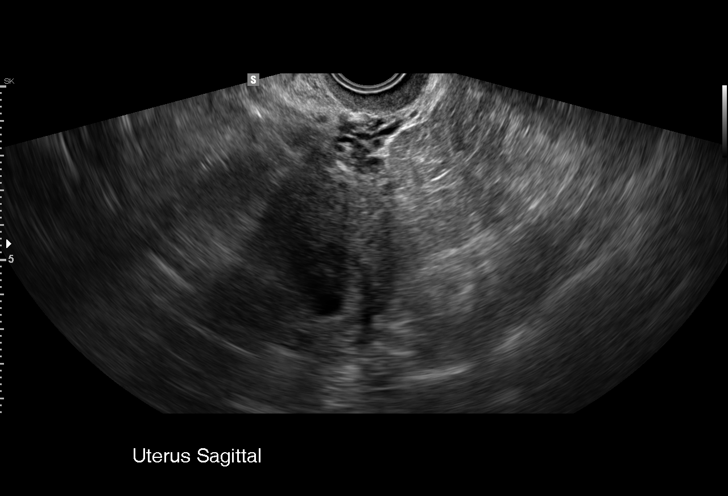
[im 36/58]
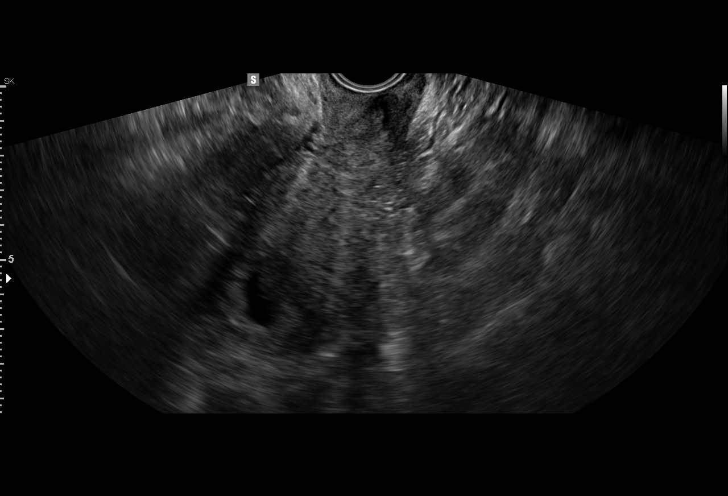
[im 41/58]
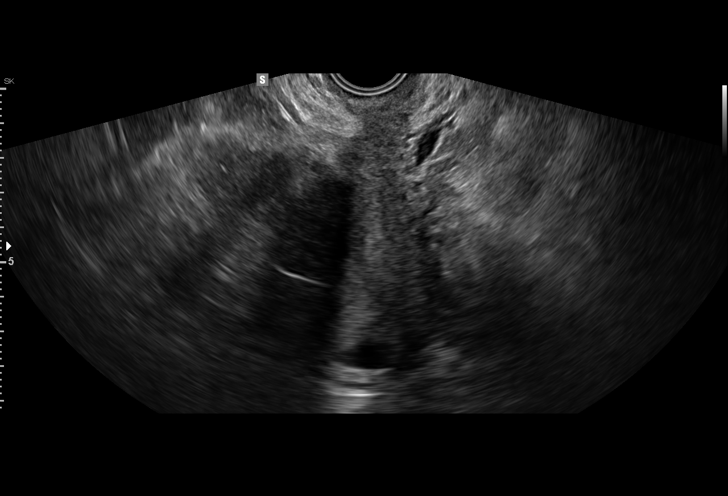
[im 45/58]
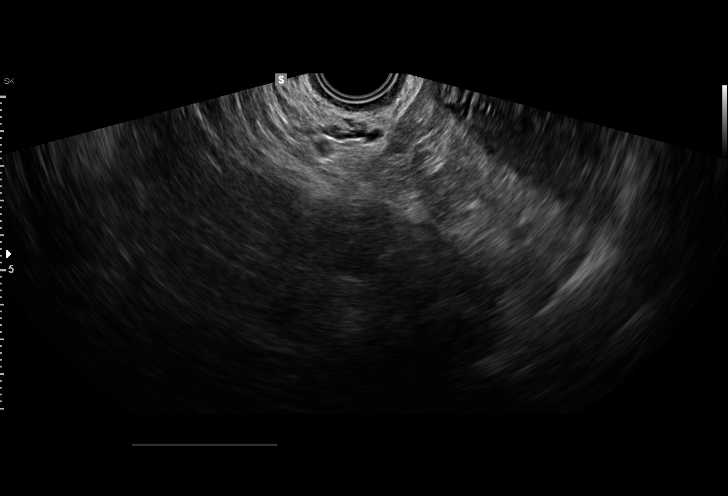
[im 49/58]
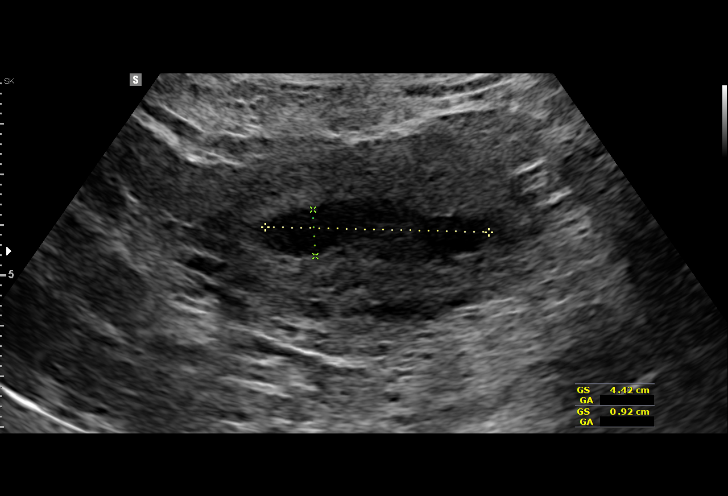
[im 53/58]
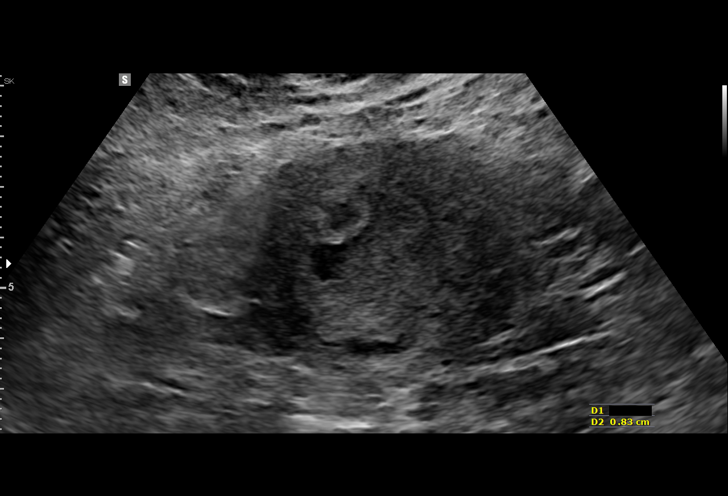
[im 58/58]
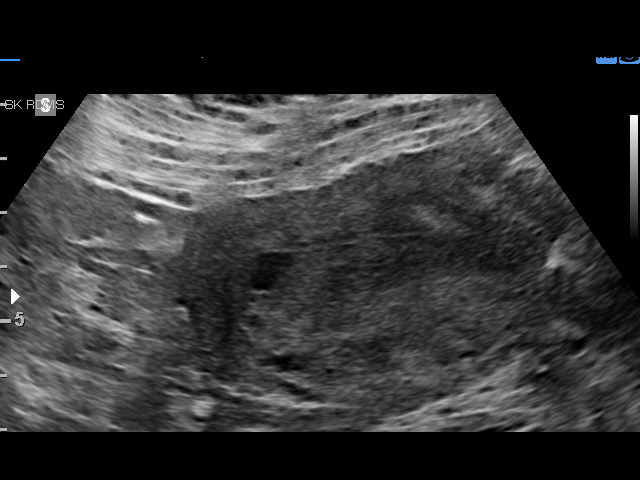

[15 of 28 positions shown; findings below may reference images not displayed]

FINDINGS: Technologist reports difficulty examination due to patient's body
habitus.

Intrauterine gestational sac: Present

Yolk sac:  Not present

Embryo:  Not present

Cardiac Activity: Not applicable

Subchorionic hemorrhage: Moderate subchorionic hemorrhage anterior
to the gestational sac.

Maternal uterus/adnexae: Normal appearance of the adnexae. No free
fluid.
IMPRESSION: Probable early intrauterine gestational sac, but no yolk sac, fetal
pole, or cardiac activity yet visualized. Recommend follow-up
quantitative B-HCG levels and follow-up US in 14 days to confirm and
assess viability. This recommendation follows SRU consensus
guidelines: Diagnostic Criteria for Nonviable Pregnancy Early in the
First Trimester. N Engl J Med 9892; [DATE].

## 2017-03-18 DIAGNOSIS — K591 Functional diarrhea: Secondary | ICD-10-CM | POA: Diagnosis not present

## 2017-03-18 DIAGNOSIS — R112 Nausea with vomiting, unspecified: Secondary | ICD-10-CM | POA: Diagnosis not present

## 2017-05-02 DIAGNOSIS — Z30011 Encounter for initial prescription of contraceptive pills: Secondary | ICD-10-CM | POA: Diagnosis not present

## 2017-05-09 DIAGNOSIS — Z3046 Encounter for surveillance of implantable subdermal contraceptive: Secondary | ICD-10-CM | POA: Diagnosis not present

## 2017-05-12 IMAGING — US US MFM OB DETAIL+14 WK
1 series · 13 of 28 positions shown · non-contrast
Comparison: none

[Series 1: us mfm ob detail+14 wk · 58 acquisitions, 13 frames shown]
[im 3/58]
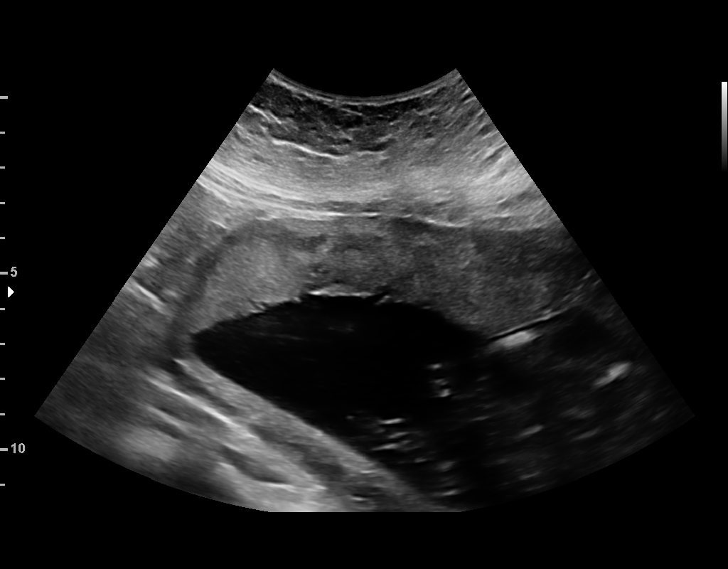
[im 7/58]
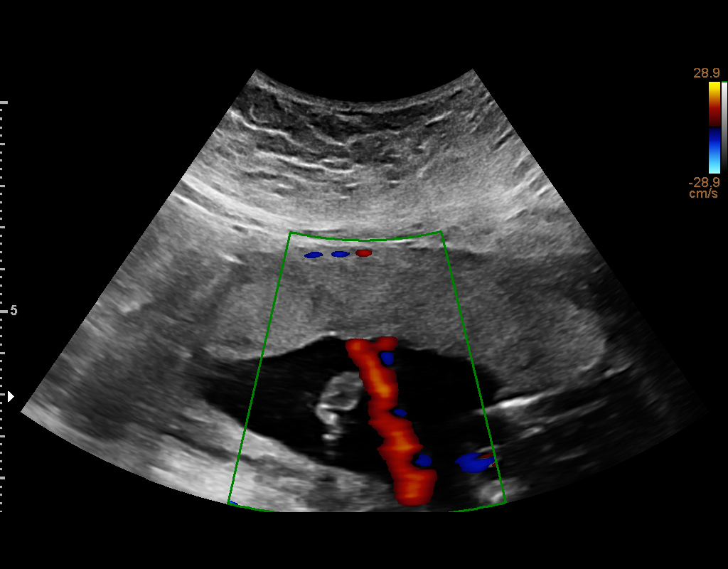
[im 11/58]
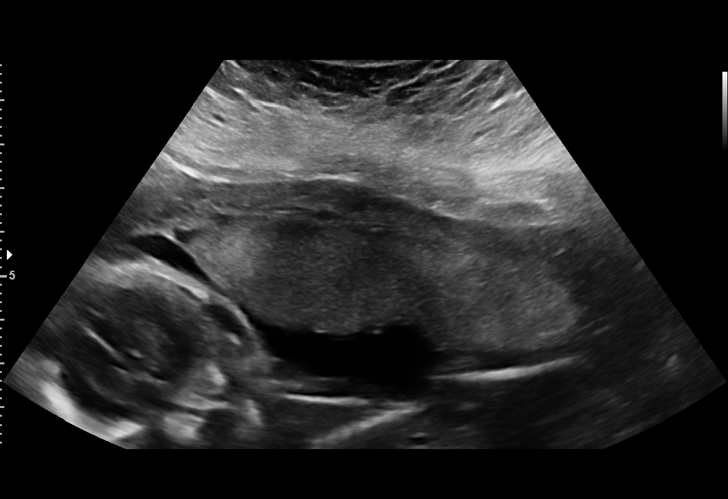
[im 15/58]
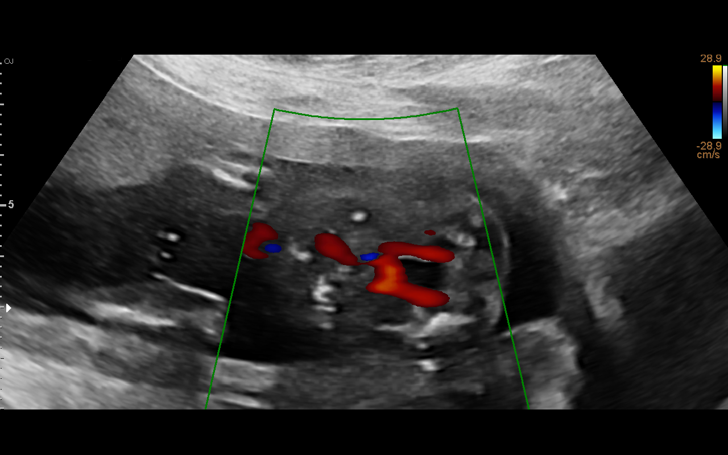
[im 20/58]
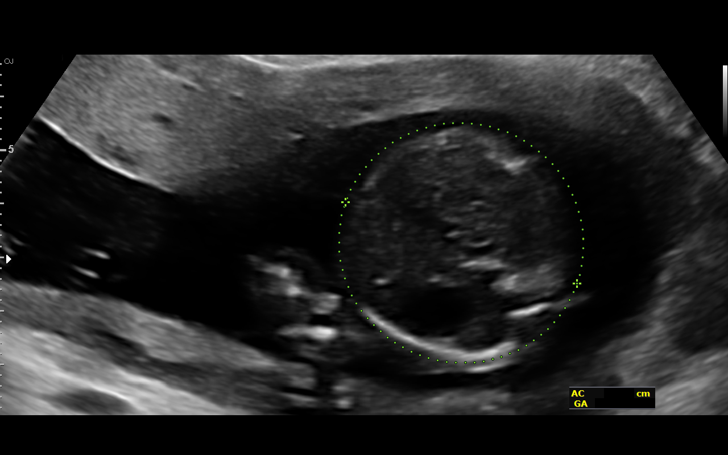
[im 24/58]
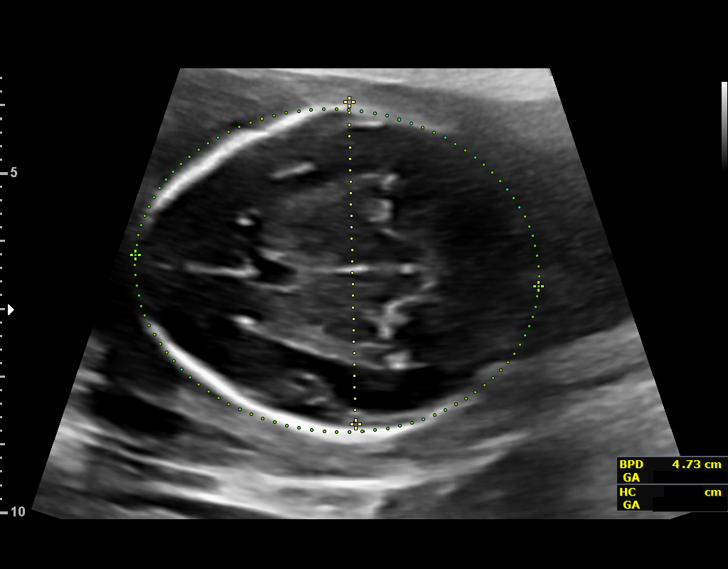
[im 30/58]
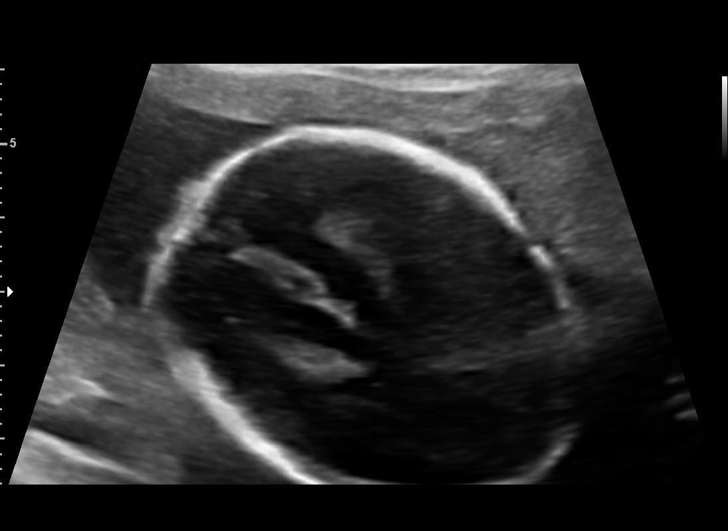
[im 34/58]
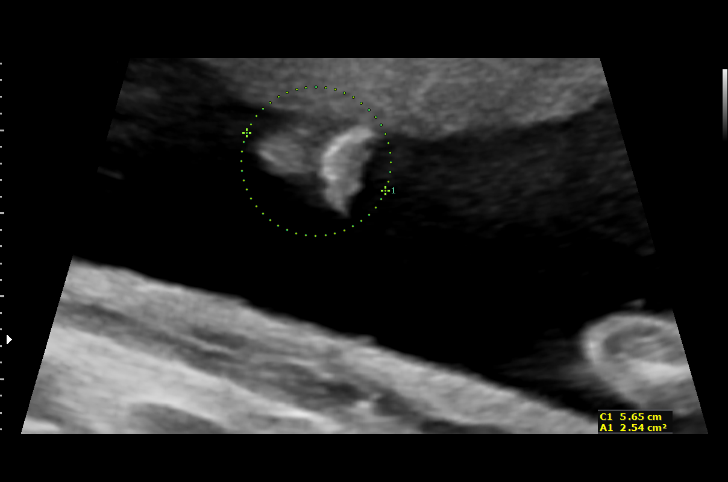
[im 39/58]
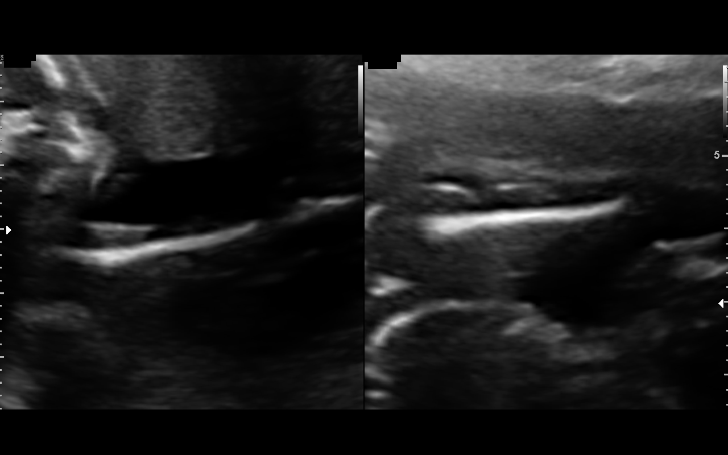
[im 43/58]
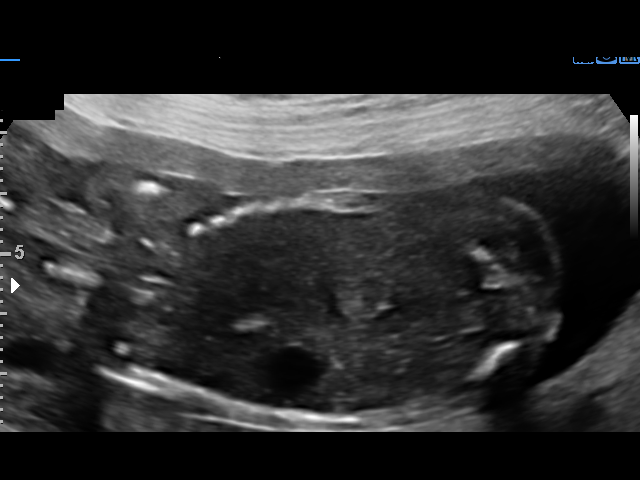
[im 47/58]
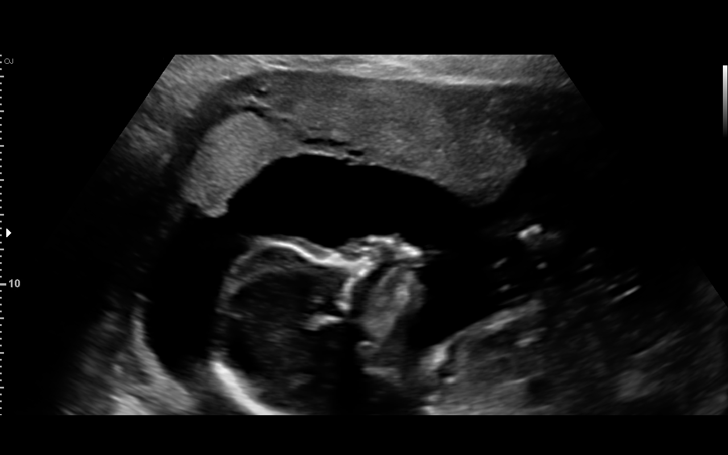
[im 51/58]
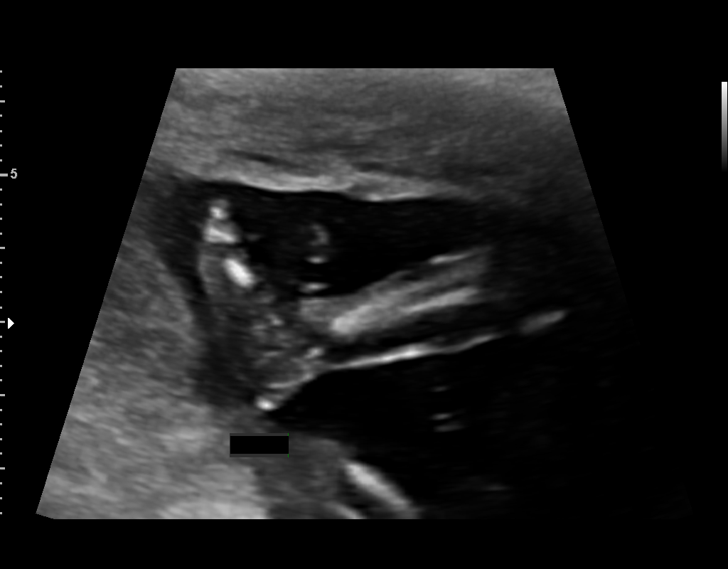
[im 55/58]
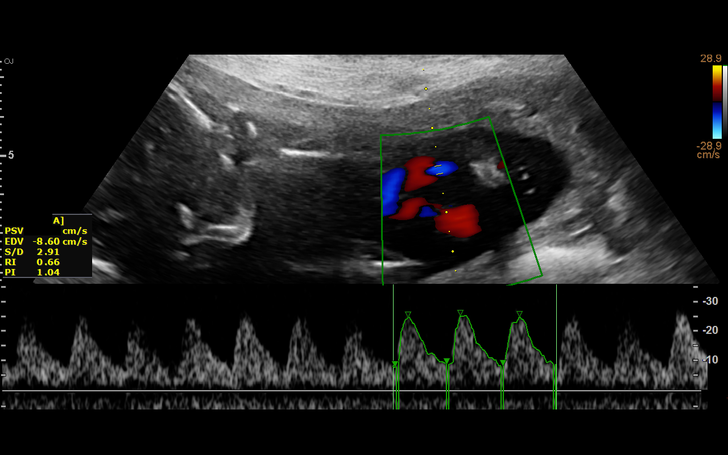

[13 of 28 positions shown; findings below may reference images not displayed]

LUMBELA DO

1  MAILYN MADA           [id number]      [id number]     [id number]
2  MAILYN MADA           [id number]      [id number]     [id number]
Indications

21 weeks gestation of pregnancy
Encounter for antenatal screening for
malformations
Elevated MSAFP (4.75 MoM); low risk GASTON FERNANDO
Obesity complicating pregnancy, second
trimester
Previous cesarean delivery, antepartum x 3
OB History

Gravidity:    4         Term:   3        Prem:   0        SAB:   0
TOP:          0       Ectopic:  0        Living: 3
Fetal Evaluation

Num Of Fetuses:     1
Fetal Heart         148
Rate(bpm):
Cardiac Activity:   Observed
Presentation:       Breech
Placenta:           Anterior, above cervical os
P. Cord Insertion:  Visualized

Amniotic Fluid
AFI FV:      Subjectively within normal limits

Largest Pocket(cm)
3.7
Biometry
BPD:      47.4  mm     G. Age:  20w 2d          8  %    CI:        78.51   %   70 - 86
FL/HC:      16.3   %   18.4 -
HC:      169.2  mm     G. Age:  19w 4d        < 3  %    HC/AC:      1.19       1.06 -
AC:      142.5  mm     G. Age:  19w 4d        < 3  %    FL/BPD:     58.2   %   71 - 87
FL:       27.6  mm     G. Age:  18w 3d        < 3  %    FL/AC:      19.4   %   20 - 24
HUM:      27.7  mm     G. Age:  18w 6d        < 5  %
CER:      22.8  mm     G. Age:  21w 3d         44  %

CM:        4.6  mm

Est. FW:     277  gm    0 lb 10 oz    < 10  %
Gestational Age

LMP:           24w 4d       Date:   09/25/15                 EDD:   07/01/16
U/S Today:     19w 3d                                        EDD:   08/06/16
Best:          21w 4d    Det. By:   Early Ultrasound         EDD:   07/22/16
(12/05/15)
Anatomy

Cranium:               Appears normal         Aortic Arch:            Appears normal
Cavum:                 Appears normal         Ductal Arch:            Appears normal
Ventricles:            Appears normal         Diaphragm:              Appears normal
Choroid Plexus:        Appears normal         Stomach:                Appears normal, left
sided
Cerebellum:            Appears normal         Abdomen:                Appears normal
Posterior Fossa:       Appears normal         Abdominal Wall:         Not well visualized
Nuchal Fold:           Not applicable (>20    Cord Vessels:           Appears normal (3
wks GA)                                        vessel cord)
Face:                  Appears normal         Kidneys:                Appear normal
(orbits and profile)
Lips:                  Appears normal         Bladder:                Appears normal
Thoracic:              Appears normal         Spine:                  Ltd views no
intracranial signs of
NT
Heart:                 Appears normal         Upper Extremities:      Appears normal
(4CH, axis, and situs
RVOT:                  Appears normal         Lower Extremities:      Appears normal
LVOT:                  Not well visualized

Other:  Heels visualized. Technically difficult due to maternal habitus and
fetal position.
Doppler - Fetal Vessels

Umbilical Artery
S/D     %tile     RI              PI              PSV
(cm/s)
3.04       17

Cervix Uterus Adnexa

Cervix
Length:            3.1  cm.
Normal appearance by transabdominal scan.

Uterus
No abnormality visualized.
Impression

SIUP at 21+4 weeks
Normal detailed fetal anatomy; limited views of LVOT, CI and
spine; no evidence of an abnormal opening in the fetus i.e.
ONTD or abdominal wall defect
Markers of aneuploidy: none
Normal amniotic fluid volume
EFW < 10th %tile; AC < 3rd %tile; 277 grams; 0+10
UA dopplers were normal for this GA

The US findings were shared with Ms. Shadesh and her
husband. The implications of early, severe fetal growth
restriction were discussed in detail including very close fetal
surveillance at viability, serial growths and most likely preterm
delivery. Ms. Shadesh requested a transfer to [facility] due to her
desire to deliver at [facility] (aunt had her preterm baby
there). She was in a hurry to get her husband to work on time
so they left without a full counseling session with me and
without genetic counseling.
Recommendations

Pt requested transfer to [facility] - done
Follow-up US for growth in 3 weeks at [facility] (04/04/16 at
Please offer TORCH titers at her next prenatal visit

## 2017-05-20 DIAGNOSIS — S61210A Laceration without foreign body of right index finger without damage to nail, initial encounter: Secondary | ICD-10-CM | POA: Diagnosis not present

## 2017-06-04 DIAGNOSIS — J111 Influenza due to unidentified influenza virus with other respiratory manifestations: Secondary | ICD-10-CM | POA: Diagnosis not present

## 2017-07-02 DIAGNOSIS — R112 Nausea with vomiting, unspecified: Secondary | ICD-10-CM | POA: Diagnosis not present

## 2017-07-02 DIAGNOSIS — K529 Noninfective gastroenteritis and colitis, unspecified: Secondary | ICD-10-CM | POA: Diagnosis not present

## 2017-07-02 DIAGNOSIS — R197 Diarrhea, unspecified: Secondary | ICD-10-CM | POA: Diagnosis not present

## 2017-07-17 DIAGNOSIS — A084 Viral intestinal infection, unspecified: Secondary | ICD-10-CM | POA: Diagnosis not present

## 2017-07-17 DIAGNOSIS — R1110 Vomiting, unspecified: Secondary | ICD-10-CM | POA: Diagnosis not present

## 2017-07-17 DIAGNOSIS — R197 Diarrhea, unspecified: Secondary | ICD-10-CM | POA: Diagnosis not present

## 2017-08-04 DIAGNOSIS — R1110 Vomiting, unspecified: Secondary | ICD-10-CM | POA: Diagnosis not present

## 2017-08-04 DIAGNOSIS — K591 Functional diarrhea: Secondary | ICD-10-CM | POA: Diagnosis not present

## 2018-09-11 DIAGNOSIS — R112 Nausea with vomiting, unspecified: Secondary | ICD-10-CM | POA: Diagnosis not present

## 2018-09-11 DIAGNOSIS — J069 Acute upper respiratory infection, unspecified: Secondary | ICD-10-CM | POA: Diagnosis not present

## 2021-02-05 ENCOUNTER — Inpatient Hospital Stay (HOSPITAL_COMMUNITY)
Admission: AD | Admit: 2021-02-05 | Discharge: 2021-02-05 | Disposition: A | Discharge status: Home / Self Care | Payer: Self-pay | Attending: Obstetrics and Gynecology | Admitting: Obstetrics and Gynecology

## 2021-02-05 ENCOUNTER — Other Ambulatory Visit: Payer: Self-pay

## 2021-02-05 DIAGNOSIS — Z3202 Encounter for pregnancy test, result negative: Secondary | ICD-10-CM

## 2021-02-05 DIAGNOSIS — R109 Unspecified abdominal pain: Secondary | ICD-10-CM | POA: Insufficient documentation

## 2021-02-05 LAB — POCT PREGNANCY, URINE: Preg Test, Ur: NEGATIVE
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
## 2021-02-05 NOTE — MAU Provider Note (Signed)
S Ms. [name] Steele is a 30 y.o. [phone] female who presents to MAU today with complaint of LAP, leaking milk from breasts, and no period in 2 months. She is concerned she may be pregnant however she had BTL almost 2 yrs ago. She has not taken a HPT. Of note she was seen in another ED ~1 month ago for same sx and workup was negative.  ROS: +abd pain  O BP 120/64 (BP Location: Right Arm)   Pulse 68   Temp 98.8 F (37.1 C) (Oral)   Resp 17   Ht 5\' 7"  (1.702 m)   Wt 85 kg   LMP  (LMP Unknown) Comment: tubes tied, ? LMP in July.  neg preg test in Aug. 'lactating'  SpO2 100%   Breastfeeding No   BMI 29.35 kg/m  Physical Exam Vitals and nursing note reviewed.  Constitutional:      General: She is not in acute distress (appears comfortable).    Appearance: Normal appearance.  HENT:     Head: Normocephalic and atraumatic.  Cardiovascular:     Rate and Rhythm: Normal rate.  Pulmonary:     Effort: Pulmonary effort is normal. No respiratory distress.  Musculoskeletal:        General: Normal range of motion.     Cervical back: Normal range of motion.  Neurological:     General: No focal deficit present.     Mental Status: She is alert and oriented to person, place, and time.  Psychiatric:        Mood and Affect: Mood normal.        Behavior: Behavior normal.   Results for orders placed or performed during the hospital encounter of 02/05/21 (from the past 24 hour(s))  Pregnancy, urine POC     Status: None   Collection Time: 02/05/21 11:15 AM  Result Value Ref Range   Preg Test, Ur NEGATIVE NEGATIVE    MDM: No signs of pregnancy or acute process. Gave pt option of ED, UC or outpt evaluation and she declines ED transfer. Provided address and phone numbers for [facility] and UC. Stable for discharge home.   A 1. Pregnancy test negative    P Discharge from MAU in stable condition Warning signs for worsening condition that would warrant emergency follow-up discussed Patient may return to  MAU as needed for pregnancy related complaints  [facility], CNM 02/05/2021 11:41 AM

## 2023-06-01 ENCOUNTER — Encounter (HOSPITAL_COMMUNITY): Payer: Self-pay

## 2023-06-01 ENCOUNTER — Ambulatory Visit (HOSPITAL_BASED_OUTPATIENT_CLINIC_OR_DEPARTMENT_OTHER): Payer: Self-pay | Admitting: Child and Adolescent Psychiatry

## 2023-06-01 DIAGNOSIS — Z91199 Patient's noncompliance with other medical treatment and regimen due to unspecified reason: Secondary | ICD-10-CM

## 2023-06-01 NOTE — Progress Notes (Signed)
Pt was sent link via text and email to connect on video for telemedicine encounter for scheduled appointment at 8:02 and 8:06 am, writer also followed up with phone call. Pt did not connect on the video, and could not leave VM as pt's voice mailbox was full. Waited until 8:16 AM for pt, pt did not connect, marked as no show at 8:16 AM.

## 2023-10-10 NOTE — Progress Notes (Deleted)
   Office Visit Note  Patient: [name] Steele             Date of Birth: May 29, 1990           MRN: [id number]             PCP: Pcp, No Referring: Colene Dauphin, MD Visit Date: 10/11/2023 Occupation: @GUAROCC @  Subjective:  No chief complaint on file.   History of Present Illness: [name] Steele is a 33 y.o. female ***     Activities of Daily Living:  Patient reports morning stiffness for *** {minute/hour:19697}.   Patient {ACTIONS;DENIES/REPORTS:21021675::"Denies"} nocturnal pain.  Difficulty dressing/grooming: {ACTIONS;DENIES/REPORTS:21021675::"Denies"} Difficulty climbing stairs: {ACTIONS;DENIES/REPORTS:21021675::"Denies"} Difficulty getting out of chair: {ACTIONS;DENIES/REPORTS:21021675::"Denies"} Difficulty using hands for taps, buttons, cutlery, and/or writing: {ACTIONS;DENIES/REPORTS:21021675::"Denies"}  No Rheumatology ROS completed.   PMFS History:  Patient Active Problem List   Diagnosis Date Noted   Migraines 08/24/2013   ADHD (attention deficit hyperactivity disorder), combined type 08/24/2013   Depression 08/24/2013    Past Medical History:  Diagnosis Date   ADHD (attention deficit hyperactivity disorder)    Anxiety    Asthma    Chronic pelvic pain in female    Depression    Fatigue    Irregular menses    Migraine     No family history on file. Past Surgical History:  Procedure Laterality Date   CESAREAN SECTION  2008,2010   WISDOM TOOTH EXTRACTION     Social History   Social History Narrative   Not on file    There is no immunization history on file for this patient.   Objective: Vital Signs: There were no vitals taken for this visit.   Physical Exam   Musculoskeletal Exam: ***  CDAI Exam: CDAI Score: -- Patient Global: --; Provider Global: -- Swollen: --; Tender: -- Joint Exam 10/11/2023   No joint exam has been documented for this visit   There is currently no information documented on the homunculus. Go to the Rheumatology  activity and complete the homunculus joint exam.  Investigation: No additional findings.  Imaging: No results found.  Recent Labs: Lab Results  Component Value Date   WBC 10.0 11/23/2015   HGB 11.7 (L) 11/23/2015   PLT 228 11/23/2015   NA 144 08/24/2013   K 4.3 08/24/2013   CL 104 08/24/2013   CO2 21 08/24/2013   GLUCOSE 91 08/24/2013   BUN 9 08/24/2013   CREATININE 0.88 08/24/2013   BILITOT 0.3 08/24/2013   ALKPHOS 79 08/24/2013   AST 9 08/24/2013   ALT 14 08/24/2013   PROT 7.1 08/24/2013   ALBUMIN 4.8 08/24/2013   CALCIUM 9.9 08/24/2013   GFRAA 106 08/24/2013    Speciality Comments: No specialty comments available.  Procedures:  No procedures performed Allergies: Strawberry flavor [flavoring agent (non-screening)], Codeine, Penicillins, and Sulfa antibiotics   Assessment / Plan:     Visit Diagnoses: No diagnosis found.  Orders: No orders of the defined types were placed in this encounter.  No orders of the defined types were placed in this encounter.   Face-to-face time spent with patient was *** minutes. Greater than 50% of time was spent in counseling and coordination of care.  Follow-Up Instructions: No follow-ups on file.   Matt Song, MD  Note - This record has been created using [employer].  Chart creation errors have been sought, but may not always  have been located. Such creation errors do not reflect on  the standard of medical care.

## 2023-10-11 ENCOUNTER — Ambulatory Visit: Payer: BLUE CROSS/BLUE SHIELD | Admitting: Internal Medicine

## 2024-03-12 ENCOUNTER — Emergency Department (HOSPITAL_COMMUNITY)

## 2024-03-12 ENCOUNTER — Emergency Department (HOSPITAL_COMMUNITY)
Admission: EM | Admit: 2024-03-12 | Discharge: 2024-03-12 | Discharge status: Against Medical Advice | Source: Ambulatory Visit

## 2024-03-12 ENCOUNTER — Encounter (HOSPITAL_COMMUNITY): Payer: Self-pay | Admitting: Pharmacy Technician

## 2024-03-12 ENCOUNTER — Other Ambulatory Visit: Payer: Self-pay

## 2024-03-12 DIAGNOSIS — R112 Nausea with vomiting, unspecified: Secondary | ICD-10-CM | POA: Diagnosis present

## 2024-03-12 DIAGNOSIS — Z5321 Procedure and treatment not carried out due to patient leaving prior to being seen by health care provider: Secondary | ICD-10-CM | POA: Diagnosis not present

## 2024-03-12 DIAGNOSIS — R0789 Other chest pain: Secondary | ICD-10-CM | POA: Diagnosis not present

## 2024-03-12 DIAGNOSIS — R202 Paresthesia of skin: Secondary | ICD-10-CM | POA: Diagnosis not present

## 2024-03-12 DIAGNOSIS — R5381 Other malaise: Secondary | ICD-10-CM | POA: Insufficient documentation

## 2024-03-12 DIAGNOSIS — R519 Headache, unspecified: Secondary | ICD-10-CM | POA: Diagnosis not present

## 2024-03-12 MED ORDER — ALPRAZOLAM 0.25 MG PO TABS: 0.5000 mg | ORAL_TABLET | Freq: Once | ORAL | Status: DC

## 2024-03-12 NOTE — ED Notes (Signed)
 PT was ambulatory and left with all belongings.

## 2024-03-12 NOTE — ED Triage Notes (Signed)
 Pt bib ems from UC with reports of diaphoresis and emesis X1. En route pt started with tachypnea and saying she is not alert. Now saying she can not see or move and has total body numbness. Pt noted to be moving hands.  142/94 HR 113 98% RA CBG 121

## 2024-03-12 NOTE — ED Provider Triage Note (Signed)
 Emergency Medicine Provider Triage Evaluation Note  [name] Steele , a 33 y.o. female  was evaluated in triage.  Patient with multiple complaints.  Went to urgent care had generalized malaise yesterday, some nausea and vomiting this morning.  Think she had a seizure while at urgent care.  She then had some paresthesias to her legs up to her core.  These have seemingly happened before.  Has a mild headache.  Is complaining of some chest tightness  Review of Systems  Positive: Nausea vomiting Negative: Abdominal pain  Physical Exam  BP 132/84 (BP Location: Right Arm)   Pulse (!) 128   Temp 98.2 F (36.8 C) (Oral)   Resp (!) 23   SpO2 98%  Gen:   Awake, appears anxious  Resp:  Normal effort  MSK:   Moves extremities without difficulty  Other:  Non-focal neurologic exam   Medical Decision Making  Medically screening exam initiated at 10:04 AM.  Appropriate orders placed.  [name] Steele was informed that the remainder of the evaluation will be completed by another provider, this initial triage assessment does not replace that evaluation, and the importance of remaining in the ED until their evaluation is complete.  33 year old with multiple complaints.  Self-reports a history of lupus, fibromyalgia, anxiety, ADHD and migraines.  Appears extremely anxious on exam.  Possible seizure?  She reports a history of epilepsy, but not on any seizure medications.  Will get broad screening labs.  Given Xanax for her anxiety.   [name] Steele PARAS, DO 03/12/24 1006

## 2024-03-12 NOTE — ED Notes (Signed)
  PT is requesting to go AMA. She is going to visit her PCP.
